# Patient Record
Sex: Male | Born: 1955 | Race: White | Hispanic: No | Marital: Married | Smoking: Never smoker
Health system: Southern US, Community
[De-identification: ages and names within clinical notes are randomized; demographics above are authoritative.]

## PROBLEM LIST (undated history)

## (undated) DIAGNOSIS — E119 Type 2 diabetes mellitus without complications: Secondary | ICD-10-CM

## (undated) DIAGNOSIS — R0600 Dyspnea, unspecified: Secondary | ICD-10-CM

## (undated) DIAGNOSIS — M1990 Unspecified osteoarthritis, unspecified site: Secondary | ICD-10-CM

## (undated) DIAGNOSIS — G629 Polyneuropathy, unspecified: Secondary | ICD-10-CM

## (undated) DIAGNOSIS — I5032 Chronic diastolic (congestive) heart failure: Secondary | ICD-10-CM

## (undated) DIAGNOSIS — J189 Pneumonia, unspecified organism: Secondary | ICD-10-CM

## (undated) DIAGNOSIS — I10 Essential (primary) hypertension: Secondary | ICD-10-CM

## (undated) DIAGNOSIS — T4145XA Adverse effect of unspecified anesthetic, initial encounter: Secondary | ICD-10-CM

## (undated) DIAGNOSIS — J45909 Unspecified asthma, uncomplicated: Secondary | ICD-10-CM

## (undated) DIAGNOSIS — T8859XA Other complications of anesthesia, initial encounter: Secondary | ICD-10-CM

---

## 1981-06-16 HISTORY — PX: WRIST SURGERY: SHX841

## 2015-06-17 DIAGNOSIS — J189 Pneumonia, unspecified organism: Secondary | ICD-10-CM

## 2015-06-17 HISTORY — DX: Pneumonia, unspecified organism: J18.9

## 2016-01-18 ENCOUNTER — Other Ambulatory Visit: Payer: Self-pay | Admitting: Internal Medicine

## 2016-01-18 DIAGNOSIS — R600 Localized edema: Secondary | ICD-10-CM

## 2016-01-18 DIAGNOSIS — R0600 Dyspnea, unspecified: Secondary | ICD-10-CM

## 2016-01-29 ENCOUNTER — Other Ambulatory Visit (HOSPITAL_COMMUNITY): Payer: Self-pay

## 2016-03-03 ENCOUNTER — Other Ambulatory Visit (HOSPITAL_COMMUNITY): Payer: Self-pay

## 2016-03-08 DIAGNOSIS — E119 Type 2 diabetes mellitus without complications: Secondary | ICD-10-CM

## 2016-03-08 DIAGNOSIS — I10 Essential (primary) hypertension: Secondary | ICD-10-CM | POA: Diagnosis present

## 2016-03-24 ENCOUNTER — Ambulatory Visit
Admission: RE | Admit: 2016-03-24 | Discharge: 2016-03-24 | Disposition: A | Discharge status: Home / Self Care | Payer: 59 | Source: Ambulatory Visit | Attending: Internal Medicine | Admitting: Internal Medicine

## 2016-03-24 ENCOUNTER — Other Ambulatory Visit: Payer: Self-pay | Admitting: Internal Medicine

## 2016-03-24 DIAGNOSIS — J189 Pneumonia, unspecified organism: Secondary | ICD-10-CM

## 2016-03-24 DIAGNOSIS — J45901 Unspecified asthma with (acute) exacerbation: Secondary | ICD-10-CM

## 2016-08-07 ENCOUNTER — Ambulatory Visit
Admission: RE | Admit: 2016-08-07 | Discharge: 2016-08-07 | Disposition: A | Discharge status: Home / Self Care | Payer: 59 | Source: Ambulatory Visit | Attending: Internal Medicine | Admitting: Internal Medicine

## 2016-08-07 ENCOUNTER — Other Ambulatory Visit: Payer: Self-pay | Admitting: Internal Medicine

## 2016-08-07 DIAGNOSIS — J45901 Unspecified asthma with (acute) exacerbation: Secondary | ICD-10-CM

## 2017-01-29 ENCOUNTER — Other Ambulatory Visit: Payer: Self-pay | Admitting: Nurse Practitioner

## 2017-01-29 DIAGNOSIS — R1011 Right upper quadrant pain: Secondary | ICD-10-CM

## 2017-01-30 ENCOUNTER — Emergency Department (HOSPITAL_COMMUNITY)
Admission: EM | Admit: 2017-01-30 | Discharge: 2017-01-30 | Disposition: A | Discharge status: Home / Self Care | Payer: Managed Care, Other (non HMO) | Attending: Emergency Medicine | Admitting: Emergency Medicine

## 2017-01-30 ENCOUNTER — Ambulatory Visit
Admission: RE | Admit: 2017-01-30 | Discharge: 2017-01-30 | Disposition: A | Discharge status: Home / Self Care | Payer: Managed Care, Other (non HMO) | Source: Ambulatory Visit | Attending: Nurse Practitioner | Admitting: Nurse Practitioner

## 2017-01-30 ENCOUNTER — Encounter (HOSPITAL_COMMUNITY): Payer: Self-pay | Admitting: *Deleted

## 2017-01-30 DIAGNOSIS — I10 Essential (primary) hypertension: Secondary | ICD-10-CM | POA: Diagnosis not present

## 2017-01-30 DIAGNOSIS — R1011 Right upper quadrant pain: Secondary | ICD-10-CM

## 2017-01-30 DIAGNOSIS — K8020 Calculus of gallbladder without cholecystitis without obstruction: Secondary | ICD-10-CM | POA: Insufficient documentation

## 2017-01-30 DIAGNOSIS — E119 Type 2 diabetes mellitus without complications: Secondary | ICD-10-CM | POA: Insufficient documentation

## 2017-01-30 DIAGNOSIS — J45909 Unspecified asthma, uncomplicated: Secondary | ICD-10-CM | POA: Insufficient documentation

## 2017-01-30 HISTORY — DX: Unspecified asthma, uncomplicated: J45.909

## 2017-01-30 HISTORY — DX: Essential (primary) hypertension: I10

## 2017-01-30 HISTORY — DX: Type 2 diabetes mellitus without complications: E11.9

## 2017-01-30 LAB — COMPREHENSIVE METABOLIC PANEL
ALT: 23 U/L (ref 17–63)
ANION GAP: 8 (ref 5–15)
AST: 14 U/L — ABNORMAL LOW (ref 15–41)
Albumin: 3.8 g/dL (ref 3.5–5.0)
Alkaline Phosphatase: 43 U/L (ref 38–126)
BILIRUBIN TOTAL: 1.8 mg/dL — AB (ref 0.3–1.2)
BUN: 18 mg/dL (ref 6–20)
CO2: 28 mmol/L (ref 22–32)
Calcium: 9 mg/dL (ref 8.9–10.3)
Chloride: 99 mmol/L — ABNORMAL LOW (ref 101–111)
Creatinine, Ser: 1.05 mg/dL (ref 0.61–1.24)
Glucose, Bld: 295 mg/dL — ABNORMAL HIGH (ref 65–99)
POTASSIUM: 3.4 mmol/L — AB (ref 3.5–5.1)
Sodium: 135 mmol/L (ref 135–145)
TOTAL PROTEIN: 6.5 g/dL (ref 6.5–8.1)

## 2017-01-30 LAB — URINALYSIS, ROUTINE W REFLEX MICROSCOPIC
BACTERIA UA: NONE SEEN
BILIRUBIN URINE: NEGATIVE
Glucose, UA: >=500 mg/dL — AB
Hgb urine dipstick: NEGATIVE
KETONES UR: NEGATIVE mg/dL
LEUKOCYTES UA: NEGATIVE
NITRITE: NEGATIVE
PH: 6 (ref 5.0–8.0)
Protein, ur: 100 mg/dL — AB
Specific Gravity, Urine: 1.022 (ref 1.005–1.030)

## 2017-01-30 LAB — CBC
HEMATOCRIT: 44.5 % (ref 39.0–52.0)
HEMOGLOBIN: 15.6 g/dL (ref 13.0–17.0)
MCH: 29.4 pg (ref 26.0–34.0)
MCHC: 35.1 g/dL (ref 30.0–36.0)
MCV: 83.8 fL (ref 78.0–100.0)
Platelets: 206 10*3/uL (ref 150–400)
RBC: 5.31 MIL/uL (ref 4.22–5.81)
RDW: 14 % (ref 11.5–15.5)
WBC: 9 10*3/uL (ref 4.0–10.5)

## 2017-01-30 LAB — LIPASE, BLOOD: Lipase: 24 U/L (ref 11–51)

## 2017-01-30 NOTE — ED Triage Notes (Signed)
Pt sent from MD office with increased WBCs, abdominal pain and had ultrasound that showed acute chole with fluid and elevated lipase. Patient states he is to have surgery.

## 2017-01-30 NOTE — ED Provider Notes (Signed)
[facility]-EMERGENCY DEPT Provider Note   CSN: [id number] Arrival date & time: 01/30/17  1112     History   Chief Complaint Chief Complaint  Patient presents with  . Abdominal Pain    HPI Hunter Lewis is a 61 y.o. male.  HPI Patient presents to the emergency room for evaluation of abdominal pain. Yesterday morning the patient had an acute episode of pain in the right upper quadrant. This started after he ate breakfast.  He did not have any nausea or vomiting. He went to his primary care doctor's office. He had laboratory tests and was scheduled for an outpatient ultrasound that he had this morning. His laboratory tests yesterday reportedly showed abnormalities in his white blood cell count. His outpatient ultrasound today showed findings suggesting and of acute cholecystitis.  He was told to come to the emergency room.  Patient states his pain resolved after taking a Vicodin last night but he did have pain for most of the day. He has not had anything to eat or drink today however because he was told not to eat or drink anything in case he needed surgery. Past Medical History:  Diagnosis Date  . Asthma   . Diabetes mellitus without complication (HCC)   . Hypertension     There are no active problems to display for this patient.   Past Surgical History:  Procedure Laterality Date  . WRIST SURGERY         Home Medications    Prior to Admission medications   Not on File    Family History No family history on file.  Social History Social History  Substance Use Topics  . Smoking status: Never Smoker  . Smokeless tobacco: Never Used  . Alcohol use No     Allergies   Patient has no known allergies.   Review of Systems Review of Systems  All other systems reviewed and are negative.    Physical Exam Updated Vital Signs BP 137/81 (BP Location: Right Arm)   Pulse 83   Temp 98.7 F (37.1 C) (Oral)   Resp 18   SpO2 96%   Physical Exam  Constitutional: He  appears well-developed and well-nourished. No distress.  HENT:  Head: Normocephalic and atraumatic.  Right Ear: External ear normal.  Left Ear: External ear normal.  Eyes: Conjunctivae are normal. Right eye exhibits no discharge. Left eye exhibits no discharge. No scleral icterus.  Neck: Neck supple. No tracheal deviation present.  Cardiovascular: Normal rate, regular rhythm and intact distal pulses.   Pulmonary/Chest: Effort normal and breath sounds normal. No stridor. No respiratory distress. He has no wheezes. He has no rales.  Abdominal: Soft. Bowel sounds are normal. He exhibits no distension. There is tenderness (mild ruq). There is no rebound and no guarding.  Musculoskeletal: He exhibits no edema or tenderness.  Neurological: He is alert. He has normal strength. No cranial nerve deficit (no facial droop, extraocular movements intact, no slurred speech) or sensory deficit. He exhibits normal muscle tone. He displays no seizure activity. Coordination normal.  Skin: Skin is warm and dry. No rash noted.  Psychiatric: He has a normal mood and affect.  Nursing note and vitals reviewed.    ED Treatments / Results  Labs (all labs ordered are listed, but only abnormal results are displayed) Labs Reviewed  COMPREHENSIVE METABOLIC PANEL - Abnormal; Notable for the following:       Result Value   Potassium 3.4 (*)    Chloride 99 (*)  Glucose, Bld 295 (*)    AST 14 (*)    Total Bilirubin 1.8 (*)    All other components within normal limits  URINALYSIS, ROUTINE W REFLEX MICROSCOPIC - Abnormal; Notable for the following:    Glucose, UA >=500 (*)    Protein, ur 100 (*)    Squamous Epithelial / LPF 0-5 (*)    All other components within normal limits  LIPASE, BLOOD  CBC     Radiology US Abdomen Limited Ruq  Result Date: 01/30/2017 CLINICAL DATA:  Right upper quadrant pain for 2 days. EXAM: ULTRASOUND ABDOMEN LIMITED RIGHT UPPER QUADRANT COMPARISON:  None. FINDINGS: Gallbladder:  Layering small calcified gallstones are identified, measuring up to 7 mm diameter. There is gallbladder wall thickening with wall thickness measuring up to 6 mm in some areas. Apparent foci of pericholecystic fluid evident. The sonographer reports a positive sonographic Murphy sign. Common bile duct: Diameter: 3 mm Liver: Increased echogenicity suggests fatty deposition. IMPRESSION: Sonographic imaging features compatible with acute cholecystitis. These results will be called to the ordering clinician or representative by the Radiologist Assistant, and communication documented in the PACS or zVision Dashboard. Electronically Signed   By: Kennith Center M.D.   On: 01/30/2017 10:01    Procedures Procedures (including critical care time)  Medications Ordered in ED Medications - No data to display   Initial Impression / Assessment and Plan / ED Course  I have reviewed the triage vital signs and the nursing notes.  Pertinent labs & imaging results that were available during my care of the patient were reviewed by me and considered in my medical decision making (see chart for details).  Clinical Course as of Jan 30 1801  Fri Jan 30, 2017  1715 Discussed case with Dr Lindie Spruce.  He will see the patient in the ED  [JK]  1750 Dr Lindie Spruce discussed options with the patient.  Pt would prefer to go home.  He is pain free.  [JK]    Clinical Course User Index [JK] Linwood Dibbles, MD   Patient presented to the emergency room for evaluation of possible acute cholecystitis. Pt states he was told to come to the ED to have surgery. The patient's ultrasound this morning showed findings suggestive of acute cholecystitis. Patient's laboratory tests emergency room however are reassuring. He states his pain has primarily resolved although he has some very mild tenderness to palpation right upper quadrant. It has remained nothing by mouth throughout the day. I will consult with general surgery.  Patient was seen by Dr. Lindie Spruce.   Options were discussed with the patient. He has a vacation trip coming up that he would like to attend. He continues to feel well and is ready to go home.  Final Clinical Impressions(s) / ED Diagnoses   Final diagnoses:  Calculus of gallbladder without cholecystitis without obstruction    New Prescriptions New Prescriptions   No medications on file     Linwood Dibbles, MD 01/30/17 [phone]

## 2017-02-17 ENCOUNTER — Ambulatory Visit: Payer: Self-pay | Admitting: General Surgery

## 2017-03-11 ENCOUNTER — Inpatient Hospital Stay (HOSPITAL_COMMUNITY)
Admission: RE | Admit: 2017-03-11 | Discharge: 2017-03-11 | Disposition: A | Discharge status: Home / Self Care | Payer: 59 | Source: Ambulatory Visit

## 2017-03-11 NOTE — Pre-Procedure Instructions (Signed)
Hunter Lewis  03/11/2017      [id number] - [place], [place] - [address] [place] [address] Phone: [phone] Fax: [phone]    Your procedure is scheduled on Monday, 03/16/2017.  Report to [facility] Admitting at 0530 A.M.  Call this number if you have problems the morning of surgery:  [phone]   Remember:  Do not eat food or drink liquids after midnight.    Continue all other medications as directed by your physician except follow these medication instructions before surgery   Take these medicines the morning of surgery with A SIP OF WATER:  Albuterol inhaler - if needed  Albuterol nebulizer - if needed  Amlodipine (Norvasc)  Metoprolol Succinate (Toprol-XL)    7 days prior to surgery STOP taking any Aspirin, Aleve, Naproxen, Ibuprofen, Motrin, Advil, Goody's, BC's, all herbal medications, fish oil, and all vitamins   WHAT DO I DO ABOUT MY DIABETES MEDICATION? o Do not take oral diabetes medicines (pills) the morning of surgery.  o THE NIGHT BEFORE SURGERY, take 15 units of Glargine (Lantus) insulin. o THE MORNING OF SURGERY no insulin  o The day of surgery, do not take other diabetes injectables, including Byetta (exenatide), Bydureon (exenatide ER), Victoza (liraglutide), or Trulicity (dulaglutide).  o If your CBG is greater than 220 mg/dL, you may take  of your sliding scale (correction) dose of insulin.   How to Manage Your Diabetes Before and After Surgery  Why is it important to control my blood sugar before and after surgery? . Improving blood sugar levels before and after surgery helps healing and can limit problems. . A way of improving blood sugar control is eating a healthy diet by: o  Eating less sugar and carbohydrates o  Increasing activity/exercise o  Talking with your doctor about reaching your blood sugar goals . High blood sugars (greater than 180 mg/dL) can raise your risk of infections  and slow your recovery, so you will need to focus on controlling your diabetes during the weeks before surgery. . Make sure that the doctor who takes care of your diabetes knows about your planned surgery including the date and location.  How do I manage my blood sugar before surgery? . Check your blood sugar at least 4 times a day, starting 2 days before surgery, to make sure that the level is not too high or low. o Check your blood sugar the morning of your surgery when you wake up and every 2 hours until you get to the Short Stay unit. . If your blood sugar is less than 70 mg/dL, you will need to treat for low blood sugar: o Do not take insulin. o Treat a low blood sugar (less than 70 mg/dL) with  cup of clear juice (cranberry or apple), 4 glucose tablets, OR glucose gel. o Recheck blood sugar in 15 minutes after treatment (to make sure it is greater than 70 mg/dL). If your blood sugar is not greater than 70 mg/dL on recheck, call [phone] for further instructions. . Report your blood sugar to the short stay nurse when you get to Short Stay.  . If you are admitted to the hospital after surgery: o Your blood sugar will be checked by the staff and you will probably be given insulin after surgery (instead of oral diabetes medicines) to make sure you have good blood sugar levels. o The goal for blood sugar control after surgery is 80-180 mg/dL.  Do not wear jewelry, make-up or nail polish.  Do not wear lotions, powders, or perfumes, or deoderant.  Do not shave 48 hours prior to surgery.  Men may shave face and neck.  Do not bring valuables to the hospital.  [facility] is not responsible for any belongings or valuables.  Contacts, eyeglasses, dentures or bridgework may not be worn into surgery.  Leave your suitcase in the car.  After surgery it may be brought to your room.  For patients admitted to the hospital, discharge time will be determined by your treatment team.  Patients  discharged the day of surgery will not be allowed to drive home.   Name and phone number of your driver:    Special instructions:   [facility]- Preparing For Surgery  Before surgery, you can play an important role. Because skin is not sterile, your skin needs to be as free of germs as possible. You can reduce the number of germs on your skin by washing with CHG (chlorahexidine gluconate) Soap before surgery.  CHG is an antiseptic cleaner which kills germs and bonds with the skin to continue killing germs even after washing.  Please do not use if you have an allergy to CHG or antibacterial soaps. If your skin becomes reddened/irritated stop using the CHG.  Do not shave (including legs and underarms) for at least 48 hours prior to first CHG shower. It is OK to shave your face.  Please follow these instructions carefully.   1. Shower the NIGHT BEFORE SURGERY and the MORNING OF SURGERY with CHG.   2. If you chose to wash your hair, wash your hair first as usual with your normal shampoo.  3. After you shampoo, rinse your hair and body thoroughly to remove the shampoo.  4. Use CHG as you would any other liquid soap. You can apply CHG directly to the skin and wash gently with a scrungie or a clean washcloth.   5. Apply the CHG Soap to your body ONLY FROM THE NECK DOWN.  Do not use on open wounds or open sores. Avoid contact with your eyes, ears, mouth and genitals (private parts). Wash genitals (private parts) with your normal soap.  6. Wash thoroughly, paying special attention to the area where your surgery will be performed.  7. Thoroughly rinse your body with warm water from the neck down.  8. DO NOT shower/wash with your normal soap after using and rinsing off the CHG Soap.  9. Pat yourself dry with a CLEAN TOWEL.   10. Wear CLEAN PAJAMAS   11. Place CLEAN SHEETS on your bed the night of your first shower and DO NOT SLEEP WITH PETS.    Day of Surgery: Do not apply any  deodorants/lotions. Please wear clean clothes to the hospital/surgery center.      Please read over the following fact sheets that you were given. Pain Booklet, Coughing and Deep Breathing and Surgical Site Infection Prevention

## 2017-03-13 ENCOUNTER — Encounter (HOSPITAL_COMMUNITY): Payer: Self-pay

## 2017-03-13 ENCOUNTER — Encounter (HOSPITAL_COMMUNITY)
Admission: RE | Admit: 2017-03-13 | Discharge: 2017-03-13 | Disposition: A | Discharge status: Home / Self Care | Payer: Managed Care, Other (non HMO) | Source: Ambulatory Visit | Attending: General Surgery | Admitting: General Surgery

## 2017-03-13 DIAGNOSIS — Z8249 Family history of ischemic heart disease and other diseases of the circulatory system: Secondary | ICD-10-CM | POA: Diagnosis not present

## 2017-03-13 DIAGNOSIS — Z79891 Long term (current) use of opiate analgesic: Secondary | ICD-10-CM | POA: Diagnosis not present

## 2017-03-13 DIAGNOSIS — Z836 Family history of other diseases of the respiratory system: Secondary | ICD-10-CM | POA: Diagnosis not present

## 2017-03-13 DIAGNOSIS — Z833 Family history of diabetes mellitus: Secondary | ICD-10-CM | POA: Diagnosis not present

## 2017-03-13 DIAGNOSIS — Z79899 Other long term (current) drug therapy: Secondary | ICD-10-CM | POA: Diagnosis not present

## 2017-03-13 DIAGNOSIS — Z9889 Other specified postprocedural states: Secondary | ICD-10-CM | POA: Diagnosis not present

## 2017-03-13 DIAGNOSIS — K8010 Calculus of gallbladder with chronic cholecystitis without obstruction: Secondary | ICD-10-CM | POA: Diagnosis not present

## 2017-03-13 DIAGNOSIS — Z7984 Long term (current) use of oral hypoglycemic drugs: Secondary | ICD-10-CM | POA: Diagnosis not present

## 2017-03-13 DIAGNOSIS — M1990 Unspecified osteoarthritis, unspecified site: Secondary | ICD-10-CM | POA: Diagnosis not present

## 2017-03-13 DIAGNOSIS — J90 Pleural effusion, not elsewhere classified: Secondary | ICD-10-CM | POA: Diagnosis not present

## 2017-03-13 DIAGNOSIS — J45909 Unspecified asthma, uncomplicated: Secondary | ICD-10-CM | POA: Diagnosis not present

## 2017-03-13 DIAGNOSIS — I10 Essential (primary) hypertension: Secondary | ICD-10-CM | POA: Diagnosis not present

## 2017-03-13 DIAGNOSIS — E119 Type 2 diabetes mellitus without complications: Secondary | ICD-10-CM | POA: Diagnosis not present

## 2017-03-13 DIAGNOSIS — Z01818 Encounter for other preprocedural examination: Secondary | ICD-10-CM

## 2017-03-13 HISTORY — DX: Pneumonia, unspecified organism: J18.9

## 2017-03-13 HISTORY — DX: Dyspnea, unspecified: R06.00

## 2017-03-13 HISTORY — DX: Adverse effect of unspecified anesthetic, initial encounter: T41.45XA

## 2017-03-13 HISTORY — DX: Polyneuropathy, unspecified: G62.9

## 2017-03-13 HISTORY — DX: Other complications of anesthesia, initial encounter: T88.59XA

## 2017-03-13 HISTORY — DX: Unspecified osteoarthritis, unspecified site: M19.90

## 2017-03-13 LAB — CBC WITH DIFFERENTIAL/PLATELET
BASOS ABS: 0.1 10*3/uL (ref 0.0–0.1)
Basophils Relative: 1 %
EOS PCT: 2 %
Eosinophils Absolute: 0.2 10*3/uL (ref 0.0–0.7)
HCT: 47.3 % (ref 39.0–52.0)
Hemoglobin: 15.8 g/dL (ref 13.0–17.0)
LYMPHS ABS: 0.9 10*3/uL (ref 0.7–4.0)
LYMPHS PCT: 11 %
MCH: 28.2 pg (ref 26.0–34.0)
MCHC: 33.4 g/dL (ref 30.0–36.0)
MCV: 84.3 fL (ref 78.0–100.0)
MONO ABS: 0.5 10*3/uL (ref 0.1–1.0)
MONOS PCT: 6 %
Neutro Abs: 6.8 10*3/uL (ref 1.7–7.7)
Neutrophils Relative %: 80 %
PLATELETS: 219 10*3/uL (ref 150–400)
RBC: 5.61 MIL/uL (ref 4.22–5.81)
RDW: 14 % (ref 11.5–15.5)
WBC: 8.4 10*3/uL (ref 4.0–10.5)

## 2017-03-13 LAB — COMPREHENSIVE METABOLIC PANEL
ALT: 30 U/L (ref 17–63)
ANION GAP: 9 (ref 5–15)
AST: 21 U/L (ref 15–41)
Albumin: 4.2 g/dL (ref 3.5–5.0)
Alkaline Phosphatase: 82 U/L (ref 38–126)
BILIRUBIN TOTAL: 1.2 mg/dL (ref 0.3–1.2)
BUN: 22 mg/dL — ABNORMAL HIGH (ref 6–20)
CHLORIDE: 98 mmol/L — AB (ref 101–111)
CO2: 29 mmol/L (ref 22–32)
Calcium: 9.5 mg/dL (ref 8.9–10.3)
Creatinine, Ser: 1.19 mg/dL (ref 0.61–1.24)
Glucose, Bld: 296 mg/dL — ABNORMAL HIGH (ref 65–99)
POTASSIUM: 3.5 mmol/L (ref 3.5–5.1)
Sodium: 136 mmol/L (ref 135–145)
TOTAL PROTEIN: 6.9 g/dL (ref 6.5–8.1)

## 2017-03-13 LAB — HEMOGLOBIN A1C
Hgb A1c MFr Bld: 9.1 % — ABNORMAL HIGH (ref 4.8–5.6)
MEAN PLASMA GLUCOSE: 214.47 mg/dL

## 2017-03-13 LAB — GLUCOSE, CAPILLARY: GLUCOSE-CAPILLARY: 307 mg/dL — AB (ref 65–99)

## 2017-03-13 NOTE — Progress Notes (Signed)
Mr Hunter Lewis has Type II diabetes, it is managed by DR Polite, patient's CBG.  Patient does not remember last A1C, when it was or the result.  "I haven't seen Dr Nehemiah Settle in a while."  Patient reports that normal fasting CBG is 150- 180.  Patient took mediation for diabetes including 45 units of Humalog., he did not eat, had coffee with powdered creamer.

## 2017-03-13 NOTE — Pre-Procedure Instructions (Signed)
Hunter Lewis  03/13/2017      [id number] - [place], [place] - [address] [place] [address] Phone: [phone] Fax: [phone]    Your procedure is scheduled on Monday, 03/16/2017.  Report to [facility] Admitting at 0530 A.M.  Call this number if you have problems the morning of surgery:  [phone]   Remember:  Do not eat food or drink liquids after midnight.    Continue all other medications as directed by your physician except follow these medication instructions before surgery   Take these medicines the morning of surgery with A SIP OF WATER:  Albuterol inhaler - if needed  Albuterol nebulizer - if needed  Amlodipine (Norvasc)  Metoprolol Succinate (Toprol-XL)    7 days prior to surgery STOP taking any Aspirin, Aleve, Naproxen, Ibuprofen, Motrin, Advil, Goody's, BC's, all herbal medications, fish oil, and all vitamins   WHAT DO I DO ABOUT MY DIABETES MEDICATION? o Do not take oral diabetes medicines (pills) the morning of surgery.  o THE NIGHT BEFORE SURGERY, take 15 units of Glargine (Lantus) insulin. o The day of surgery, do not take other diabetes injectables, including Byetta (exenatide), Bydureon (exenatide ER), Victoza (liraglutide), or Trulicity (dulaglutide).  o If your CBG is greater than 220 mg/dL, you may take  of your sliding scale (correction) dose of insulin.   How to Manage Your Diabetes Before and After Surgery  Why is it important to control my blood sugar before and after surgery? . Improving blood sugar levels before and after surgery helps healing and can limit problems. . A way of improving blood sugar control is eating a healthy diet by: o  Eating less sugar and carbohydrates o  Increasing activity/exercise o  Talking with your doctor about reaching your blood sugar goals . High blood sugars (greater than 180 mg/dL) can raise your risk of infections and slow your recovery, so you will  need to focus on controlling your diabetes during the weeks before surgery. . Make sure that the doctor who takes care of your diabetes knows about your planned surgery including the date and location.  How do I manage my blood sugar before surgery? . Check your blood sugar at least 4 times a day, starting 2 days before surgery, to make sure that the level is not too high or low. o Check your blood sugar the morning of your surgery when you wake up and every 2 hours until you get to the Short Stay unit. . If your blood sugar is less than 70 mg/dL, you will need to treat for low blood sugar: o Do not take insulin. o Treat a low blood sugar (less than 70 mg/dL) with  cup of clear juice (cranberry or apple), 4 glucose tablets, OR glucose gel. o Recheck blood sugar in 15 minutes after treatment (to make sure it is greater than 70 mg/dL). If your blood sugar is not greater than 70 mg/dL on recheck, call [phone] for further instructions. . Report your blood sugar to the short stay nurse when you get to Short Stay.  . If you are admitted to the hospital after surgery: o Your blood sugar will be checked by the staff and you will probably be given insulin after surgery (instead of oral diabetes medicines) to make sure you have good blood sugar levels. o The goal for blood sugar control after surgery is 80-180 mg/dL.     Do not wear jewelry, make-up  or nail polish.  Do not wear lotions, powders, or perfumes, or deoderant.  Do not shave 48 hours prior to surgery.  Men may shave face and neck.  Do not bring valuables to the hospital.  [facility] is not responsible for any belongings or valuables.  Contacts, eyeglasses, dentures or bridgework may not be worn into surgery.  Leave your suitcase in the car.  After surgery it may be brought to your room.  For patients admitted to the hospital, discharge time will be determined by your treatment team.  Patients discharged the day of surgery will not  be allowed to drive home.   Name and phone number of your driver:    Special instructions:   [facility]- Preparing For Surgery  Before surgery, you can play an important role. Because skin is not sterile, your skin needs to be as free of germs as possible. You can reduce the number of germs on your skin by washing with CHG (chlorahexidine gluconate) Soap before surgery.  CHG is an antiseptic cleaner which kills germs and bonds with the skin to continue killing germs even after washing.  Please do not use if you have an allergy to CHG or antibacterial soaps. If your skin becomes reddened/irritated stop using the CHG.  Do not shave (including legs and underarms) for at least 48 hours prior to first CHG shower. It is OK to shave your face.  Please follow these instructions carefully.   1. Shower the NIGHT BEFORE SURGERY and the MORNING OF SURGERY with CHG.   2. If you chose to wash your hair, wash your hair first as usual with your normal shampoo.  3. After you shampoo, rinse your hair and body thoroughly to remove the shampoo.  4. Use CHG as you would any other liquid soap. You can apply CHG directly to the skin and wash gently with a scrungie or a clean washcloth.   5. Apply the CHG Soap to your body ONLY FROM THE NECK DOWN.  Do not use on open wounds or open sores. Avoid contact with your eyes, ears, mouth and genitals (private parts). Wash genitals (private parts) with your normal soap.  6. Wash thoroughly, paying special attention to the area where your surgery will be performed.  7. Thoroughly rinse your body with warm water from the neck down.  8. DO NOT shower/wash with your normal soap after using and rinsing off the CHG Soap.  9. Pat yourself dry with a CLEAN TOWEL.   10. Wear CLEAN PAJAMAS   11. Place CLEAN SHEETS on your bed the night of your first shower and DO NOT SLEEP WITH PETS.    Day of Surgery: Do not apply any deodorants/lotions. Please wear clean clothes to  the hospital/surgery center.      Please read over the following fact sheets that you were given. Pain Booklet, Coughing and Deep Breathing and Surgical Site Infection Prevention

## 2017-03-13 NOTE — Progress Notes (Signed)
Anesthesia Chart Review:  Pt is a 61 year old male scheduled for laparoscopic cholecystectomy with intraoperative cholangiogram on 03/16/2017 with Jimmye Norman, MD  - PCP is Renford Dills, MD  PMH includes:  HTN, DM, asthma. Never smoker. BMI 34  Medications include: Albuterol, amlodipine, fenofibrate, Lasix, basaglar kwikpen, humalog, lisinopril-HCTZ, metformin, metoprolol  BP (!) 152/78   Pulse 87   Temp 36.6 C   Resp 20   Ht 6' (1.829 m)   Wt 249 lb (112.9 kg)   SpO2 95%   BMI 33.77 kg/m   Preoperative labs reviewed.  HbA1c 9.1, glucose 296.   CXR 03/13/17:  - Interstitial prominence and thickening with areas of scarring. Suspect chronic inflammatory type change, although mild interstitial edema cannot be entirely excluded. No airspace consolidation. - Minimal left pleural effusion. Heart size normal. Mild pulmonary venous hypertension is noted. A degree of pulmonary vascular congestion may well present.  EKG 03/13/17: NSR  Reviewed case with Dr. Michelle Piper.  Pt will need further assessment by assigned anesthesiologist DOS.   Rica Mast, FNP-BC [facility] Short Stay Surgical Center/Anesthesiology Phone: [phone] 03/13/2017 10:42 AM

## 2017-03-16 ENCOUNTER — Encounter (HOSPITAL_COMMUNITY): Payer: Self-pay | Admitting: Certified Registered"

## 2017-03-16 ENCOUNTER — Ambulatory Visit (HOSPITAL_COMMUNITY)
Admission: RE | Admit: 2017-03-16 | Discharge: 2017-03-16 | Disposition: A | Discharge status: Home / Self Care | Payer: Managed Care, Other (non HMO) | Source: Ambulatory Visit | Attending: General Surgery | Admitting: General Surgery

## 2017-03-16 ENCOUNTER — Encounter (HOSPITAL_COMMUNITY)
Admission: RE | Disposition: A | Discharge status: Home / Self Care | Payer: Self-pay | Source: Ambulatory Visit | Attending: General Surgery

## 2017-03-16 ENCOUNTER — Ambulatory Visit (HOSPITAL_COMMUNITY): Payer: Managed Care, Other (non HMO) | Admitting: Emergency Medicine

## 2017-03-16 ENCOUNTER — Ambulatory Visit (HOSPITAL_COMMUNITY): Payer: Managed Care, Other (non HMO) | Admitting: Anesthesiology

## 2017-03-16 DIAGNOSIS — K8010 Calculus of gallbladder with chronic cholecystitis without obstruction: Secondary | ICD-10-CM | POA: Diagnosis not present

## 2017-03-16 DIAGNOSIS — Z79899 Other long term (current) drug therapy: Secondary | ICD-10-CM | POA: Insufficient documentation

## 2017-03-16 DIAGNOSIS — J45909 Unspecified asthma, uncomplicated: Secondary | ICD-10-CM | POA: Insufficient documentation

## 2017-03-16 DIAGNOSIS — E119 Type 2 diabetes mellitus without complications: Secondary | ICD-10-CM | POA: Insufficient documentation

## 2017-03-16 DIAGNOSIS — M1990 Unspecified osteoarthritis, unspecified site: Secondary | ICD-10-CM | POA: Insufficient documentation

## 2017-03-16 DIAGNOSIS — Z79891 Long term (current) use of opiate analgesic: Secondary | ICD-10-CM | POA: Insufficient documentation

## 2017-03-16 DIAGNOSIS — J90 Pleural effusion, not elsewhere classified: Secondary | ICD-10-CM | POA: Insufficient documentation

## 2017-03-16 DIAGNOSIS — Z836 Family history of other diseases of the respiratory system: Secondary | ICD-10-CM | POA: Insufficient documentation

## 2017-03-16 DIAGNOSIS — E1140 Type 2 diabetes mellitus with diabetic neuropathy, unspecified: Secondary | ICD-10-CM

## 2017-03-16 DIAGNOSIS — Z9889 Other specified postprocedural states: Secondary | ICD-10-CM | POA: Insufficient documentation

## 2017-03-16 DIAGNOSIS — I10 Essential (primary) hypertension: Secondary | ICD-10-CM | POA: Insufficient documentation

## 2017-03-16 DIAGNOSIS — Z794 Long term (current) use of insulin: Secondary | ICD-10-CM

## 2017-03-16 DIAGNOSIS — Z8249 Family history of ischemic heart disease and other diseases of the circulatory system: Secondary | ICD-10-CM | POA: Insufficient documentation

## 2017-03-16 DIAGNOSIS — Z7984 Long term (current) use of oral hypoglycemic drugs: Secondary | ICD-10-CM | POA: Insufficient documentation

## 2017-03-16 DIAGNOSIS — Z833 Family history of diabetes mellitus: Secondary | ICD-10-CM | POA: Insufficient documentation

## 2017-03-16 HISTORY — PX: CHOLECYSTECTOMY: SHX55

## 2017-03-16 LAB — GLUCOSE, CAPILLARY
GLUCOSE-CAPILLARY: 216 mg/dL — AB (ref 65–99)
GLUCOSE-CAPILLARY: 259 mg/dL — AB (ref 65–99)

## 2017-03-16 SURGERY — LAPAROSCOPIC CHOLECYSTECTOMY WITH INTRAOPERATIVE CHOLANGIOGRAM
Anesthesia: General | Site: Abdomen

## 2017-03-16 MED ORDER — BUPIVACAINE-EPINEPHRINE (PF) 0.25% -1:200000 IJ SOLN
INTRAMUSCULAR | Status: AC
  Filled 2017-03-16: qty 30

## 2017-03-16 MED ORDER — PROPOFOL 10 MG/ML IV BOLUS
INTRAVENOUS | Status: AC
  Filled 2017-03-16: qty 20

## 2017-03-16 MED ORDER — OXYCODONE HCL 5 MG/5ML PO SOLN: 5.0000 mg | Freq: Once | ORAL | Status: AC | PRN

## 2017-03-16 MED ORDER — ACETAMINOPHEN 500 MG PO TABS
1000.0000 mg | ORAL_TABLET | ORAL | Status: AC
  Administered 2017-03-16: 1000 mg via ORAL
  Filled 2017-03-16: qty 2

## 2017-03-16 MED ORDER — BUPIVACAINE-EPINEPHRINE 0.25% -1:200000 IJ SOLN
INTRAMUSCULAR | Status: DC | PRN
  Administered 2017-03-16: 30 mL

## 2017-03-16 MED ORDER — ACETAMINOPHEN 160 MG/5ML PO SOLN: 325.0000 mg | ORAL | Status: DC | PRN

## 2017-03-16 MED ORDER — ACETAMINOPHEN 325 MG PO TABS: 325.0000 mg | ORAL_TABLET | ORAL | Status: DC | PRN

## 2017-03-16 MED ORDER — OXYCODONE HCL 5 MG PO TABS
ORAL_TABLET | ORAL | Status: AC
  Filled 2017-03-16: qty 1

## 2017-03-16 MED ORDER — CHLORHEXIDINE GLUCONATE CLOTH 2 % EX PADS: 6.0000 | MEDICATED_PAD | Freq: Once | CUTANEOUS | Status: DC

## 2017-03-16 MED ORDER — ALBUTEROL SULFATE HFA 108 (90 BASE) MCG/ACT IN AERS
INHALATION_SPRAY | RESPIRATORY_TRACT | Status: AC
  Filled 2017-03-16: qty 6.7

## 2017-03-16 MED ORDER — FENTANYL CITRATE (PF) 100 MCG/2ML IJ SOLN
25.0000 ug | INTRAMUSCULAR | Status: DC | PRN
  Administered 2017-03-16: 50 ug via INTRAVENOUS

## 2017-03-16 MED ORDER — ALBUTEROL SULFATE HFA 108 (90 BASE) MCG/ACT IN AERS
INHALATION_SPRAY | RESPIRATORY_TRACT | Status: DC | PRN
  Administered 2017-03-16: 2 via RESPIRATORY_TRACT
  Administered 2017-03-16: 4 via RESPIRATORY_TRACT

## 2017-03-16 MED ORDER — MIDAZOLAM HCL 2 MG/2ML IJ SOLN
INTRAMUSCULAR | Status: AC
  Filled 2017-03-16: qty 2

## 2017-03-16 MED ORDER — HEMOSTATIC AGENTS (NO CHARGE) OPTIME
TOPICAL | Status: DC | PRN
  Administered 2017-03-16 (×2): 1 via TOPICAL

## 2017-03-16 MED ORDER — ROCURONIUM BROMIDE 10 MG/ML (PF) SYRINGE
PREFILLED_SYRINGE | INTRAVENOUS | Status: AC
  Filled 2017-03-16: qty 5

## 2017-03-16 MED ORDER — ONDANSETRON HCL 4 MG/2ML IJ SOLN
INTRAMUSCULAR | Status: AC
  Filled 2017-03-16: qty 2

## 2017-03-16 MED ORDER — IOPAMIDOL (ISOVUE-300) INJECTION 61%
INTRAVENOUS | Status: AC
  Filled 2017-03-16: qty 50

## 2017-03-16 MED ORDER — CEFOTETAN DISODIUM-DEXTROSE 2-2.08 GM-% IV SOLR
2.0000 g | INTRAVENOUS | Status: AC
  Administered 2017-03-16: 2 g via INTRAVENOUS
  Filled 2017-03-16: qty 50

## 2017-03-16 MED ORDER — FENTANYL CITRATE (PF) 250 MCG/5ML IJ SOLN
INTRAMUSCULAR | Status: DC | PRN
  Administered 2017-03-16: 150 ug via INTRAVENOUS

## 2017-03-16 MED ORDER — SODIUM CHLORIDE 0.9 % IR SOLN
Status: DC | PRN
  Administered 2017-03-16 (×3): 1000 mL

## 2017-03-16 MED ORDER — ROCURONIUM BROMIDE 10 MG/ML (PF) SYRINGE
PREFILLED_SYRINGE | INTRAVENOUS | Status: DC | PRN
  Administered 2017-03-16: 70 mg via INTRAVENOUS

## 2017-03-16 MED ORDER — GABAPENTIN 300 MG PO CAPS
300.0000 mg | ORAL_CAPSULE | ORAL | Status: AC
  Administered 2017-03-16: 300 mg via ORAL
  Filled 2017-03-16: qty 1

## 2017-03-16 MED ORDER — LACTATED RINGERS IV SOLN
INTRAVENOUS | Status: DC | PRN
  Administered 2017-03-16: 07:00:00 via INTRAVENOUS

## 2017-03-16 MED ORDER — LIDOCAINE 2% (20 MG/ML) 5 ML SYRINGE
INTRAMUSCULAR | Status: AC
  Filled 2017-03-16: qty 5

## 2017-03-16 MED ORDER — SUGAMMADEX SODIUM 200 MG/2ML IV SOLN
INTRAVENOUS | Status: DC | PRN
  Administered 2017-03-16: 200 mg via INTRAVENOUS

## 2017-03-16 MED ORDER — PROPOFOL 10 MG/ML IV BOLUS
INTRAVENOUS | Status: DC | PRN
  Administered 2017-03-16: 180 mg via INTRAVENOUS
  Administered 2017-03-16: 20 mg via INTRAVENOUS

## 2017-03-16 MED ORDER — FENTANYL CITRATE (PF) 100 MCG/2ML IJ SOLN
INTRAMUSCULAR | Status: AC
  Filled 2017-03-16: qty 2

## 2017-03-16 MED ORDER — 0.9 % SODIUM CHLORIDE (POUR BTL) OPTIME
TOPICAL | Status: DC | PRN
  Administered 2017-03-16: 1000 mL

## 2017-03-16 MED ORDER — FENTANYL CITRATE (PF) 250 MCG/5ML IJ SOLN
INTRAMUSCULAR | Status: AC
  Filled 2017-03-16: qty 5

## 2017-03-16 MED ORDER — LIDOCAINE 2% (20 MG/ML) 5 ML SYRINGE
INTRAMUSCULAR | Status: DC | PRN
  Administered 2017-03-16: 60 mg via INTRAVENOUS

## 2017-03-16 MED ORDER — ONDANSETRON HCL 4 MG/2ML IJ SOLN
INTRAMUSCULAR | Status: DC | PRN
  Administered 2017-03-16: 4 mg via INTRAVENOUS

## 2017-03-16 MED ORDER — OXYCODONE HCL 5 MG PO TABS
5.0000 mg | ORAL_TABLET | Freq: Once | ORAL | Status: AC | PRN
  Administered 2017-03-16: 5 mg via ORAL

## 2017-03-16 MED ORDER — STERILE WATER FOR IRRIGATION IR SOLN
Status: DC | PRN
  Administered 2017-03-16: 60 mL

## 2017-03-16 MED ORDER — HYDROCODONE-ACETAMINOPHEN 5-325 MG PO TABS
1.0000 | ORAL_TABLET | Freq: Four times a day (QID) | ORAL | 0 refills | Status: DC | PRN
Start: 2017-03-16 — End: 2022-07-17

## 2017-03-16 SURGICAL SUPPLY — 51 items
APPLIER CLIP 5 13 M/L LIGAMAX5 (MISCELLANEOUS) ×3
BLADE CLIPPER SURG (BLADE) ×3 IMPLANT
CANISTER SUCT 3000ML PPV (MISCELLANEOUS) ×6 IMPLANT
CHLORAPREP W/TINT 26ML (MISCELLANEOUS) ×3 IMPLANT
CLIP APPLIE 5 13 M/L LIGAMAX5 (MISCELLANEOUS) ×1 IMPLANT
CLOSURE WOUND 1/2 X4 (GAUZE/BANDAGES/DRESSINGS) ×1
COVER MAYO STAND STRL (DRAPES) ×3 IMPLANT
COVER SURGICAL LIGHT HANDLE (MISCELLANEOUS) ×3 IMPLANT
DERMABOND ADVANCED (GAUZE/BANDAGES/DRESSINGS) ×2
DERMABOND ADVANCED .7 DNX12 (GAUZE/BANDAGES/DRESSINGS) ×1 IMPLANT
DRAPE C-ARM 42X72 X-RAY (DRAPES) IMPLANT
DRSG TEGADERM 2-3/8X2-3/4 SM (GAUZE/BANDAGES/DRESSINGS) ×3 IMPLANT
ELECT REM PT RETURN 9FT ADLT (ELECTROSURGICAL) ×3
ELECTRODE REM PT RTRN 9FT ADLT (ELECTROSURGICAL) ×1 IMPLANT
GLOVE BIO SURGEON STRL SZ7 (GLOVE) ×6 IMPLANT
GLOVE BIO SURGEON STRL SZ7.5 (GLOVE) ×3 IMPLANT
GLOVE BIOGEL PI IND STRL 6.5 (GLOVE) ×2 IMPLANT
GLOVE BIOGEL PI IND STRL 7.0 (GLOVE) ×1 IMPLANT
GLOVE BIOGEL PI IND STRL 7.5 (GLOVE) ×1 IMPLANT
GLOVE BIOGEL PI IND STRL 8 (GLOVE) ×1 IMPLANT
GLOVE BIOGEL PI INDICATOR 6.5 (GLOVE) ×4
GLOVE BIOGEL PI INDICATOR 7.0 (GLOVE) ×2
GLOVE BIOGEL PI INDICATOR 7.5 (GLOVE) ×2
GLOVE BIOGEL PI INDICATOR 8 (GLOVE) ×2
GLOVE ECLIPSE 7.5 STRL STRAW (GLOVE) ×3 IMPLANT
GLOVE SURG SS PI 6.5 STRL IVOR (GLOVE) ×3 IMPLANT
GOWN STRL REUS W/ TWL LRG LVL3 (GOWN DISPOSABLE) ×5 IMPLANT
GOWN STRL REUS W/TWL LRG LVL3 (GOWN DISPOSABLE) ×10
HEMOSTAT SNOW SURGICEL 2X4 (HEMOSTASIS) ×6 IMPLANT
KIT BASIN OR (CUSTOM PROCEDURE TRAY) ×3 IMPLANT
KIT ROOM TURNOVER OR (KITS) ×3 IMPLANT
L-HOOK LAP DISP 36CM (ELECTROSURGICAL)
LHOOK LAP DISP 36CM (ELECTROSURGICAL) IMPLANT
NS IRRIG 1000ML POUR BTL (IV SOLUTION) ×3 IMPLANT
PAD ARMBOARD 7.5X6 YLW CONV (MISCELLANEOUS) ×3 IMPLANT
PENCIL BUTTON HOLSTER BLD 10FT (ELECTRODE) IMPLANT
POUCH RETRIEVAL ECOSAC 10 (ENDOMECHANICALS) ×1 IMPLANT
POUCH RETRIEVAL ECOSAC 10MM (ENDOMECHANICALS) ×2
SCISSORS LAP 5X35 DISP (ENDOMECHANICALS) ×3 IMPLANT
SET CHOLANGIOGRAPH 5 50 .035 (SET/KITS/TRAYS/PACK) IMPLANT
SET IRRIG TUBING LAPAROSCOPIC (IRRIGATION / IRRIGATOR) ×3 IMPLANT
SLEEVE ENDOPATH XCEL 5M (ENDOMECHANICALS) ×6 IMPLANT
SPECIMEN JAR SMALL (MISCELLANEOUS) ×3 IMPLANT
STRIP CLOSURE SKIN 1/2X4 (GAUZE/BANDAGES/DRESSINGS) ×2 IMPLANT
SUT MNCRL AB 4-0 PS2 18 (SUTURE) ×3 IMPLANT
TOWEL OR 17X24 6PK STRL BLUE (TOWEL DISPOSABLE) ×3 IMPLANT
TOWEL OR 17X26 10 PK STRL BLUE (TOWEL DISPOSABLE) IMPLANT
TRAY LAPAROSCOPIC MC (CUSTOM PROCEDURE TRAY) ×3 IMPLANT
TROCAR XCEL BLUNT TIP 100MML (ENDOMECHANICALS) ×3 IMPLANT
TROCAR XCEL NON-BLD 5MMX100MML (ENDOMECHANICALS) ×3 IMPLANT
TUBING INSUFFLATION (TUBING) ×3 IMPLANT

## 2017-03-16 NOTE — Op Note (Signed)
OPERATIVE REPORT  DATE OF OPERATION: 03/16/2017  PATIENT:  Hunter Lewis  61 y.o. male  PRE-OPERATIVE DIAGNOSIS:  SYMPTOMATIC CHOLELITHISIS  POST-OPERATIVE DIAGNOSIS:  SYMPTOMATIC CHOLELITHISIS  INDICATION(S) FOR OPERATION:  Severe symptoms from known gall stones.  FINDINGS:  Multipoel small gallstones with 6-7 mc cystic duct, not IOC performed, anatomy very clear  PROCEDURE:  Procedure(s): LAPAROSCOPIC CHOLECYSTECTOMY  SURGEON:  Surgeon(s): Jimmye Norman, MD  ASSISTANTOrson Slick, RNFA  ANESTHESIA:   general  COMPLICATIONS:  None  EBL: 40 ml  BLOOD ADMINISTERED: none  DRAINS: none   SPECIMEN:  Source of Specimen:  Gallbladder and stones  COUNTS CORRECT:  YES  PROCEDURE DETAILS: The patient was taken to the operating room and placed on the table in the supine position.  After an adequate endotracheal anesthetic was administered, the patient was prepped with ChloroPrep, and then draped in the usual manner exposing the entire abdomen laterally, inferiorly and up  to the costal margins.  After a proper timeout was performed including identifying the patient and the procedure to be performed, a supraumbilical 1.5cm midline incision was made using a #15 blade.  This was taken down to the fascia which was then incised with a #15 blade.  The edges of the fascia were tented up with Kocher clamps as the preperitoneal space was penetrated with a Kelly clamp into the peritoneum.  Once this was done, a pursestring suture of 0 Vicryl was passed around the fascial opening.  This was subsequently used to secure the [facility] cannula which was passed into the peritoneal cavity.  Once the [facility] cannula was in place, carbon dioxide gas was insufflated into the peritoneal cavity up to a maximal intra-abdominal pressure of 15mm Hg.The laparoscope, with attached camera and light source, was passed into the peritoneal cavity to visualize the direct insertion of two right upper quadrant 5mm cannulas, and a  sup-xiphoid 5mm cannula.  Once all cannulas were in place, the dissection was begun.  The patient had a significant amount of adhesions of the omentum and duodenum to the gallbladder which had to be carefully dissected away in order to expose the infundibulum of the gallbladder and the cystic duct.  Two ratcheted graspers were attached to the dome and infundibulum of the gallbladder and retracted towards the anterior abdominal wall and the right upper quadrant.  Using cautery attached to a dissecting forceps, the peritoneum overlaying the triangle of Chalot and the hepatoduodenal triangle was dissected away exposing the cystic duct and the cystic artery.  A critical window was developed between the CBD and the cystic duct The cystic artery was clipped proximally and distally then transected.  A clip was placed on the gallbladder side of the cystic duct, then the distal cystic duct was clipped multiple times then transected between the clips.  The gallbladder was then dissected out of the hepatic bed without event.  It was retrieved from the abdomen (using an EndoCatch bag) without event.  Once the gallbladder was removed, the bed was inspected for hemostasis.  There was some venous bleeding from the gallbladder bed which was controlled primarily with SurgiCel snow x 2 pieces. Once excellent hemostasis was obtained all gas and fluids were aspirated from above the liver, then the cannulas were removed.  The supraumbilical incision was closed using the pursestring suture which was in place.  0.25% bupivicaine with epinephrine was injected at all sites.  All 10mm or greater cannula sites were close using a running subcuticular stitch of 4-0 Monocryl.  5.24mm cannula sites  were closed with Dermabond only.Steri-Strips and Tagaderm were used to complete the dressings at all sites.  At this point all needle, sponge, and instrument counts were correct.The patient was awakened from anesthesia and taken to the PACU in  stable condition.  [name], MD, [name] [phone] [facility]  PATIENT DISPOSITION:  PACU - hemodynamically stable.   [name] 10/1/20188:53 AM

## 2017-03-16 NOTE — Addendum Note (Signed)
Addendum  created 03/16/17 1436 by Lucinda Dell, CRNA   Anesthesia Event deleted, Anesthesia Event edited

## 2017-03-16 NOTE — Anesthesia Procedure Notes (Signed)
Procedure Name: Intubation Date/Time: 03/16/2017 7:36 AM Performed by: Myna Bright Pre-anesthesia Checklist: Patient identified, Suction available, Emergency Drugs available and Patient being monitored Patient Re-evaluated:Patient Re-evaluated prior to induction Oxygen Delivery Method: Circle system utilized Preoxygenation: Pre-oxygenation with 100% oxygen Induction Type: IV induction Ventilation: Mask ventilation with difficulty, Two handed mask ventilation required and Oral airway inserted - appropriate to patient size Laryngoscope Size: Glidescope and 4 Tube type: Oral Tube size: 7.5 mm Number of attempts: 2 Airway Equipment and Method: Stylet and Video-laryngoscopy Placement Confirmation: ETT inserted through vocal cords under direct vision,  positive ETCO2 and breath sounds checked- equal and bilateral Secured at: 23 cm Tube secured with: Tape Dental Injury: Teeth and Oropharynx as per pre-operative assessment  Comments: Attempt x1 with Mac 4 blade, grade 2A view, unable to pass tube.  Switched to Glidescope with Mac 4 blade, grade 1 view, ETT easily passed.  M. Petraglia, SRNA

## 2017-03-16 NOTE — Transfer of Care (Signed)
Immediate Anesthesia Transfer of Care Note  Patient: Hunter Lewis  Procedure(s) Performed: LAPAROSCOPIC CHOLECYSTECTOMY (N/A Abdomen)  Patient Location: PACU  Anesthesia Type:General  Level of Consciousness: awake, alert , oriented and patient cooperative  Airway & Oxygen Therapy: Patient Spontanous Breathing and Patient connected to face mask oxygen  Post-op Assessment: Report given to RN, Post -op Vital signs reviewed and stable and Patient moving all extremities  Post vital signs: Reviewed and stable  Last Vitals:  Vitals:   03/16/17 0546 03/16/17 0859  BP: (!) 166/81 (!) 150/87  Pulse: 94 83  Resp: 18 13  Temp: 37 C 36.6 C  SpO2: 96% 96%    Last Pain: There were no vitals filed for this visit.       Complications: No apparent anesthesia complications

## 2017-03-16 NOTE — Anesthesia Postprocedure Evaluation (Signed)
Anesthesia Post Note  Patient: Yovanny Coats  Procedure(s) Performed: LAPAROSCOPIC CHOLECYSTECTOMY (N/A Abdomen)     Patient location during evaluation: PACU Anesthesia Type: General Level of consciousness: awake and alert Pain management: pain level controlled Vital Signs Assessment: post-procedure vital signs reviewed and stable Respiratory status: spontaneous breathing, nonlabored ventilation, respiratory function stable and patient connected to nasal cannula oxygen Cardiovascular status: blood pressure returned to baseline and stable Postop Assessment: no apparent nausea or vomiting Anesthetic complications: no    Last Vitals:  Vitals:   03/16/17 0950 03/16/17 1000  BP: 135/76   Pulse: 86 86  Resp: 18 18  Temp:    SpO2: 91% 93%    Last Pain:  Vitals:   03/16/17 1000  PainSc: 0-No pain                 Ridhaan Dreibelbis
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
## 2017-03-16 NOTE — Discharge Instructions (Addendum)
Laparoscopic Cholecystectomy, Care After °This sheet gives you information about how to care for yourself after your procedure. Your health care provider may also give you more specific instructions. If you have problems or questions, contact your health care provider. °What can I expect after the procedure? °After the procedure, it is common to have: °· Pain at your incision sites. You will be given medicines to control this pain. °· Mild nausea or vomiting. °· Bloating and possible shoulder pain from the air-like gas that was used during the procedure. ° °Follow these instructions at home: °Incision care ° °· Follow instructions from your health care provider about how to take care of your incisions. Make sure you: °? Wash your hands with soap and water before you change your bandage (dressing). If soap and water are not available, use hand sanitizer. °? Change your dressing as told by your health care provider. °? Leave stitches (sutures), skin glue, or adhesive strips in place. These skin closures may need to be in place for 2 weeks or longer. If adhesive strip edges start to loosen and curl up, you may trim the loose edges. Do not remove adhesive strips completely unless your health care provider tells you to do that. °· Do not take baths, swim, or use a hot tub until your health care provider approves. Ask your health care provider if you can take showers. You may only be allowed to take sponge baths for bathing. °· Check your incision area every day for signs of infection. Check for: °? More redness, swelling, or pain. °? More fluid or blood. °? Warmth. °? Pus or a bad smell. °Activity °· Do not drive or use heavy machinery while taking prescription pain medicine. °· Do not lift anything that is heavier than 10 lb (4.5 kg) until your health care provider approves. °· Do not play contact sports until your health care provider approves. °· Do not drive for 24 hours if you were given a medicine to help you relax  (sedative). °· Rest as needed. Do not return to work or school until your health care provider approves. °General instructions °· Take over-the-counter and prescription medicines only as told by your health care provider. °· To prevent or treat constipation while you are taking prescription pain medicine, your health care provider may recommend that you: °? Drink enough fluid to keep your urine clear or pale yellow. °? Take over-the-counter or prescription medicines. °? Eat foods that are high in fiber, such as fresh fruits and vegetables, whole grains, and beans. °? Limit foods that are high in fat and processed sugars, such as fried and sweet foods. °Contact a health care provider if: °· You develop a rash. °· You have more redness, swelling, or pain around your incisions. °· You have more fluid or blood coming from your incisions. °· Your incisions feel warm to the touch. °· You have pus or a bad smell coming from your incisions. °· You have a fever. °· One or more of your incisions breaks open. °Get help right away if: °· You have trouble breathing. °· You have chest pain. °· You have increasing pain in your shoulders. °· You faint or feel dizzy when you stand. °· You have severe pain in your abdomen. °· You have nausea or vomiting that lasts for more than one day. °· You have leg pain. °This information is not intended to replace advice given to you by your health care provider. Make sure you discuss any questions you   have with your health care provider. ° °James O. Wyatt, III, MD, [name] °[phone]--pager °[phone]--office °[facility] ° °

## 2017-03-16 NOTE — Anesthesia Preprocedure Evaluation (Signed)
Anesthesia Evaluation  Patient identified by MRN, date of birth, ID band Patient awake    Reviewed: Allergy & Precautions, NPO status , Patient's Chart, lab work & pertinent test results, reviewed documented beta blocker date and time   History of Anesthesia Complications Negative for: history of anesthetic complications  Airway Mallampati: III  TM Distance: >3 FB Neck ROM: Full    Dental  (+) Teeth Intact   Pulmonary shortness of breath, asthma ,     + wheezing      Cardiovascular hypertension, Pt. on medications and Pt. on home beta blockers (-) angina(-) Past MI and (-) CHF  Rhythm:Regular     Neuro/Psych negative neurological ROS  negative psych ROS   GI/Hepatic negative GI ROS, Neg liver ROS,   Endo/Other  diabetes, Type 2, Oral Hypoglycemic AgentsMorbid obesity  Renal/GU negative Renal ROS     Musculoskeletal  (+) Arthritis ,   Abdominal   Peds  Hematology negative hematology ROS (+)   Anesthesia Other Findings   Reproductive/Obstetrics                             Anesthesia Physical Anesthesia Plan  ASA: II  Anesthesia Plan: General   Post-op Pain Management:    Induction: Intravenous  PONV Risk Score and Plan: 2 and Ondansetron and Dexamethasone  Airway Management Planned: Oral ETT  Additional Equipment: None  Intra-op Plan:   Post-operative Plan: Extubation in OR  Informed Consent: I have reviewed the patients History and Physical, chart, labs and discussed the procedure including the risks, benefits and alternatives for the proposed anesthesia with the patient or authorized representative who has indicated his/her understanding and acceptance.   Dental advisory given  Plan Discussed with: CRNA and Surgeon  Anesthesia Plan Comments:         Anesthesia Quick Evaluation

## 2017-03-16 NOTE — Anesthesia Preprocedure Evaluation (Signed)
Anesthesia Evaluation  Patient identified by MRN, date of birth, ID band Patient awake    Reviewed: Allergy & Precautions, NPO status , Patient's Chart, lab work & pertinent test results  Airway Mallampati: III  TM Distance: >3 FB Neck ROM: Full    Dental  (+) Poor Dentition, Missing   Pulmonary shortness of breath, asthma ,    breath sounds clear to auscultation       Cardiovascular hypertension,  Rhythm:Regular Rate:Normal     Neuro/Psych    GI/Hepatic   Endo/Other  diabetes  Renal/GU      Musculoskeletal  (+) Arthritis ,   Abdominal (+) + obese,   Peds  Hematology   Anesthesia Other Findings   Reproductive/Obstetrics                             Anesthesia Physical Anesthesia Plan  ASA: III  Anesthesia Plan: General   Post-op Pain Management:    Induction: Intravenous  PONV Risk Score and Plan: Ondansetron  Airway Management Planned: Oral ETT  Additional Equipment:   Intra-op Plan:   Post-operative Plan:   Informed Consent: I have reviewed the patients History and Physical, chart, labs and discussed the procedure including the risks, benefits and alternatives for the proposed anesthesia with the patient or authorized representative who has indicated his/her understanding and acceptance.   Dental advisory given  Plan Discussed with: Anesthesiologist and Surgeon  Anesthesia Plan Comments:         Anesthesia Quick Evaluation

## 2017-03-17 ENCOUNTER — Encounter (HOSPITAL_COMMUNITY): Payer: Self-pay | Admitting: General Surgery

## 2017-10-08 DIAGNOSIS — J45909 Unspecified asthma, uncomplicated: Secondary | ICD-10-CM | POA: Insufficient documentation

## 2018-08-22 IMAGING — CR DG CHEST 2V
2 series · 2 of 2 positions shown · non-contrast
Comparison: No priors.

CLINICAL DATA: 60-year-old male with history of pneumonia diagnosed
2 weeks ago, currently feeling better. Wheezing on examination.

EXAM:
CHEST  2 VIEW

[w chest pa]
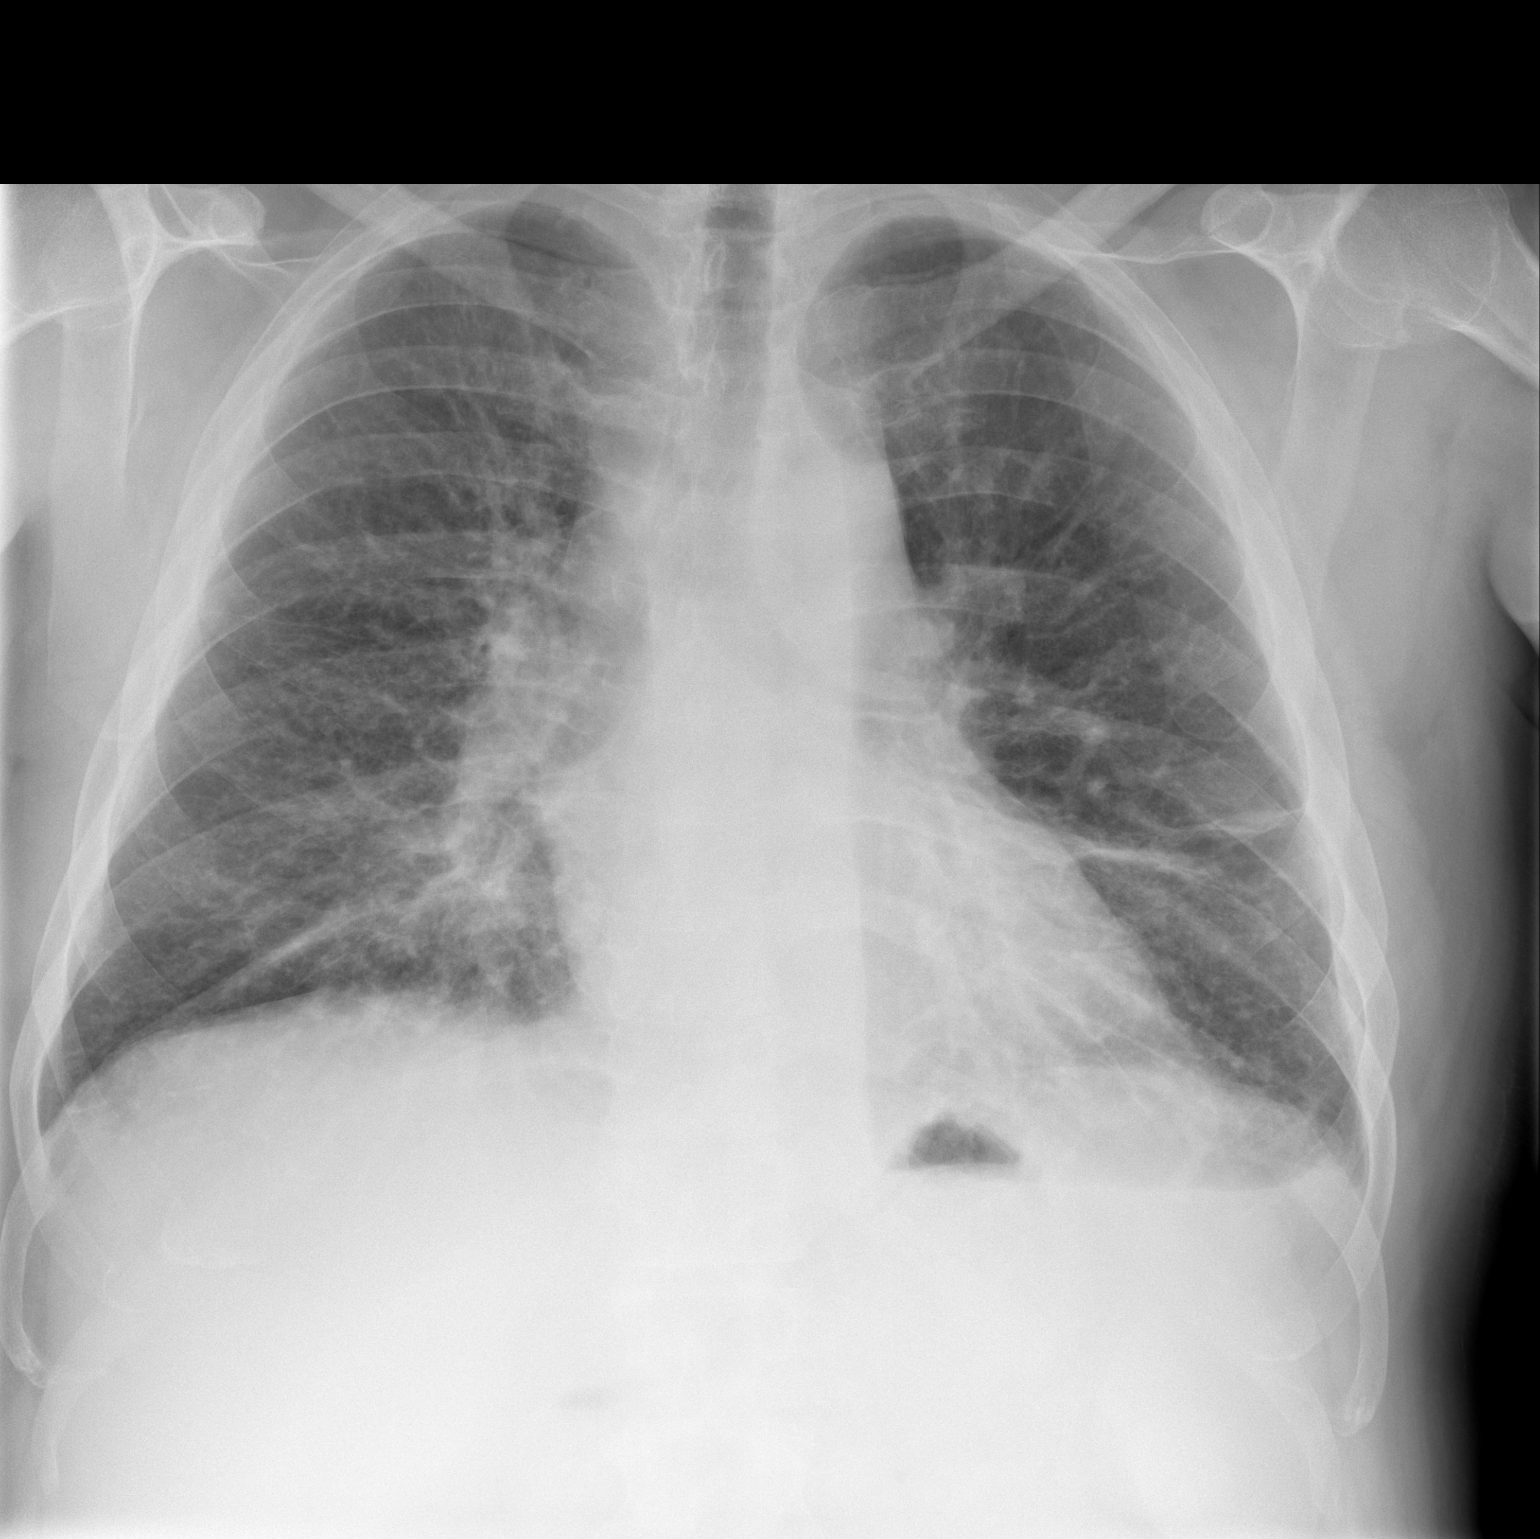

[w chest lat]
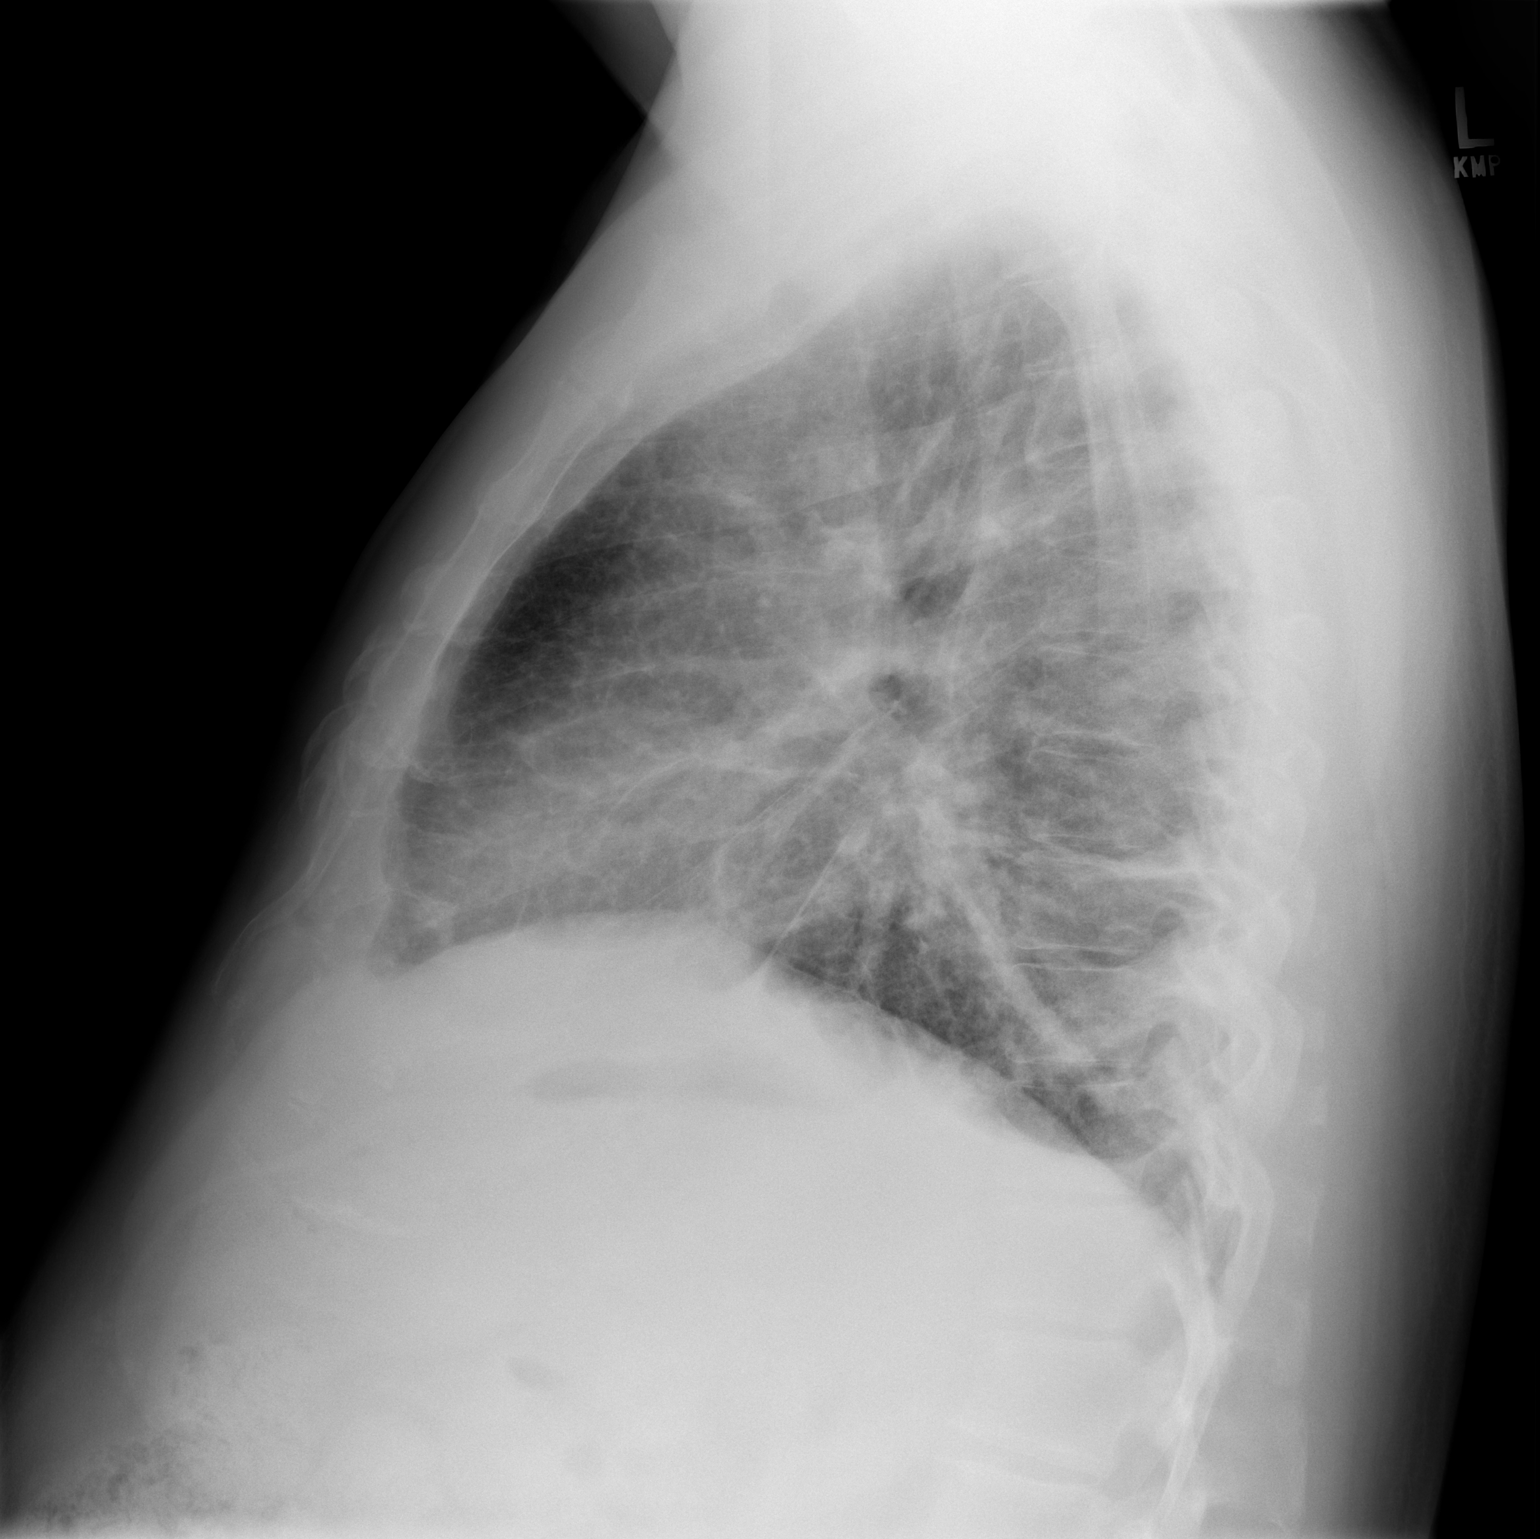

[2 of 2 positions shown; findings below may reference images not displayed]

FINDINGS: Lung volumes are low. Diffuse peribronchial cuffing. Diffuse
interstitial prominence. Linear opacities throughout the mid to
lower lungs bilaterally. No confluent consolidation. Trace left
pleural effusion. No evidence of pulmonary edema. Heart size is
normal. Upper mediastinal contours are within normal limits.
IMPRESSION: 1. The appearance the chest is concerning for bronchitis,
potentially with mild or resolving multilobar bronchopneumonia.
Followup PA and lateral chest X-ray is recommended in 3-4 weeks
following trial of antibiotic therapy to ensure resolution and
exclude underlying malignancy. Should these findings fail to resolve
on the followup, further evaluation with high-resolution chest CT
would be appropriate to better evaluate these findings.
2. Trace left pleural effusion.

## 2019-07-30 DIAGNOSIS — I5032 Chronic diastolic (congestive) heart failure: Secondary | ICD-10-CM | POA: Diagnosis present

## 2022-07-17 ENCOUNTER — Emergency Department (HOSPITAL_COMMUNITY): Payer: No Typology Code available for payment source

## 2022-07-17 ENCOUNTER — Inpatient Hospital Stay (HOSPITAL_COMMUNITY)
Admission: EM | Admit: 2022-07-17 | Discharge: 2022-07-21 | DRG: 871 | Disposition: A | Discharge status: Home / Self Care | Payer: No Typology Code available for payment source | Attending: Internal Medicine | Admitting: Internal Medicine

## 2022-07-17 ENCOUNTER — Inpatient Hospital Stay (HOSPITAL_COMMUNITY): Payer: No Typology Code available for payment source

## 2022-07-17 ENCOUNTER — Encounter (HOSPITAL_COMMUNITY): Payer: Self-pay | Admitting: Emergency Medicine

## 2022-07-17 ENCOUNTER — Other Ambulatory Visit: Payer: Self-pay

## 2022-07-17 DIAGNOSIS — I5020 Unspecified systolic (congestive) heart failure: Secondary | ICD-10-CM | POA: Diagnosis not present

## 2022-07-17 DIAGNOSIS — E1140 Type 2 diabetes mellitus with diabetic neuropathy, unspecified: Secondary | ICD-10-CM | POA: Diagnosis present

## 2022-07-17 DIAGNOSIS — Z8616 Personal history of COVID-19: Secondary | ICD-10-CM

## 2022-07-17 DIAGNOSIS — I878 Other specified disorders of veins: Secondary | ICD-10-CM | POA: Diagnosis present

## 2022-07-17 DIAGNOSIS — Z8249 Family history of ischemic heart disease and other diseases of the circulatory system: Secondary | ICD-10-CM

## 2022-07-17 DIAGNOSIS — I5032 Chronic diastolic (congestive) heart failure: Secondary | ICD-10-CM | POA: Diagnosis not present

## 2022-07-17 DIAGNOSIS — R042 Hemoptysis: Secondary | ICD-10-CM | POA: Diagnosis present

## 2022-07-17 DIAGNOSIS — A419 Sepsis, unspecified organism: Secondary | ICD-10-CM | POA: Diagnosis present

## 2022-07-17 DIAGNOSIS — E1165 Type 2 diabetes mellitus with hyperglycemia: Secondary | ICD-10-CM | POA: Diagnosis present

## 2022-07-17 DIAGNOSIS — I10 Essential (primary) hypertension: Secondary | ICD-10-CM | POA: Diagnosis present

## 2022-07-17 DIAGNOSIS — R9389 Abnormal findings on diagnostic imaging of other specified body structures: Secondary | ICD-10-CM

## 2022-07-17 DIAGNOSIS — Z794 Long term (current) use of insulin: Secondary | ICD-10-CM | POA: Diagnosis not present

## 2022-07-17 DIAGNOSIS — Z7984 Long term (current) use of oral hypoglycemic drugs: Secondary | ICD-10-CM

## 2022-07-17 DIAGNOSIS — Z1152 Encounter for screening for COVID-19: Secondary | ICD-10-CM

## 2022-07-17 DIAGNOSIS — I959 Hypotension, unspecified: Secondary | ICD-10-CM | POA: Diagnosis present

## 2022-07-17 DIAGNOSIS — E785 Hyperlipidemia, unspecified: Secondary | ICD-10-CM | POA: Diagnosis present

## 2022-07-17 DIAGNOSIS — K219 Gastro-esophageal reflux disease without esophagitis: Secondary | ICD-10-CM | POA: Diagnosis present

## 2022-07-17 DIAGNOSIS — J45901 Unspecified asthma with (acute) exacerbation: Secondary | ICD-10-CM | POA: Insufficient documentation

## 2022-07-17 DIAGNOSIS — R319 Hematuria, unspecified: Secondary | ICD-10-CM

## 2022-07-17 DIAGNOSIS — Z833 Family history of diabetes mellitus: Secondary | ICD-10-CM

## 2022-07-17 DIAGNOSIS — I4891 Unspecified atrial fibrillation: Secondary | ICD-10-CM | POA: Diagnosis not present

## 2022-07-17 DIAGNOSIS — Z79899 Other long term (current) drug therapy: Secondary | ICD-10-CM

## 2022-07-17 DIAGNOSIS — J189 Pneumonia, unspecified organism: Secondary | ICD-10-CM

## 2022-07-17 DIAGNOSIS — Z8701 Personal history of pneumonia (recurrent): Secondary | ICD-10-CM

## 2022-07-17 DIAGNOSIS — N1831 Chronic kidney disease, stage 3a: Secondary | ICD-10-CM | POA: Diagnosis present

## 2022-07-17 DIAGNOSIS — N390 Urinary tract infection, site not specified: Secondary | ICD-10-CM | POA: Diagnosis not present

## 2022-07-17 DIAGNOSIS — E119 Type 2 diabetes mellitus without complications: Secondary | ICD-10-CM

## 2022-07-17 DIAGNOSIS — I480 Paroxysmal atrial fibrillation: Secondary | ICD-10-CM | POA: Diagnosis not present

## 2022-07-17 DIAGNOSIS — I5043 Acute on chronic combined systolic (congestive) and diastolic (congestive) heart failure: Secondary | ICD-10-CM | POA: Diagnosis present

## 2022-07-17 DIAGNOSIS — Z6832 Body mass index (BMI) 32.0-32.9, adult: Secondary | ICD-10-CM

## 2022-07-17 DIAGNOSIS — J9621 Acute and chronic respiratory failure with hypoxia: Secondary | ICD-10-CM | POA: Diagnosis not present

## 2022-07-17 DIAGNOSIS — Z9049 Acquired absence of other specified parts of digestive tract: Secondary | ICD-10-CM

## 2022-07-17 DIAGNOSIS — E669 Obesity, unspecified: Secondary | ICD-10-CM | POA: Diagnosis present

## 2022-07-17 DIAGNOSIS — G4733 Obstructive sleep apnea (adult) (pediatric): Secondary | ICD-10-CM | POA: Diagnosis present

## 2022-07-17 DIAGNOSIS — J850 Gangrene and necrosis of lung: Secondary | ICD-10-CM | POA: Diagnosis present

## 2022-07-17 DIAGNOSIS — N3001 Acute cystitis with hematuria: Secondary | ICD-10-CM

## 2022-07-17 DIAGNOSIS — R791 Abnormal coagulation profile: Secondary | ICD-10-CM | POA: Diagnosis present

## 2022-07-17 DIAGNOSIS — J90 Pleural effusion, not elsewhere classified: Secondary | ICD-10-CM | POA: Diagnosis present

## 2022-07-17 DIAGNOSIS — K7460 Unspecified cirrhosis of liver: Secondary | ICD-10-CM | POA: Diagnosis present

## 2022-07-17 DIAGNOSIS — I130 Hypertensive heart and chronic kidney disease with heart failure and stage 1 through stage 4 chronic kidney disease, or unspecified chronic kidney disease: Secondary | ICD-10-CM | POA: Diagnosis present

## 2022-07-17 DIAGNOSIS — M1990 Unspecified osteoarthritis, unspecified site: Secondary | ICD-10-CM | POA: Diagnosis present

## 2022-07-17 DIAGNOSIS — I358 Other nonrheumatic aortic valve disorders: Secondary | ICD-10-CM | POA: Diagnosis present

## 2022-07-17 DIAGNOSIS — E1122 Type 2 diabetes mellitus with diabetic chronic kidney disease: Secondary | ICD-10-CM | POA: Diagnosis present

## 2022-07-17 DIAGNOSIS — N179 Acute kidney failure, unspecified: Secondary | ICD-10-CM | POA: Diagnosis present

## 2022-07-17 DIAGNOSIS — J188 Other pneumonia, unspecified organism: Secondary | ICD-10-CM | POA: Diagnosis present

## 2022-07-17 DIAGNOSIS — R161 Splenomegaly, not elsewhere classified: Secondary | ICD-10-CM

## 2022-07-17 DIAGNOSIS — R590 Localized enlarged lymph nodes: Secondary | ICD-10-CM | POA: Diagnosis present

## 2022-07-17 DIAGNOSIS — E871 Hypo-osmolality and hyponatremia: Secondary | ICD-10-CM | POA: Diagnosis present

## 2022-07-17 DIAGNOSIS — Z825 Family history of asthma and other chronic lower respiratory diseases: Secondary | ICD-10-CM

## 2022-07-17 DIAGNOSIS — Z9981 Dependence on supplemental oxygen: Secondary | ICD-10-CM

## 2022-07-17 DIAGNOSIS — J9601 Acute respiratory failure with hypoxia: Secondary | ICD-10-CM

## 2022-07-17 HISTORY — DX: Chronic diastolic (congestive) heart failure: I50.32

## 2022-07-17 LAB — URINALYSIS, W/ REFLEX TO CULTURE (INFECTION SUSPECTED)
Bilirubin Urine: NEGATIVE
Cellular Cast, UA: 1
Glucose, UA: >=500 mg/dL — AB
Ketones, ur: NEGATIVE mg/dL
Nitrite: NEGATIVE
Protein, ur: NEGATIVE mg/dL
Specific Gravity, Urine: 1.013 (ref 1.005–1.030)
pH: 5 (ref 5.0–8.0)

## 2022-07-17 LAB — CBC WITH DIFFERENTIAL/PLATELET
Abs Immature Granulocytes: 0 10*3/uL (ref 0.00–0.07)
Basophils Absolute: 0 10*3/uL (ref 0.0–0.1)
Basophils Relative: 0 %
Eosinophils Absolute: 0 10*3/uL (ref 0.0–0.5)
Eosinophils Relative: 0 %
HCT: 42.3 % (ref 39.0–52.0)
Hemoglobin: 13.3 g/dL (ref 13.0–17.0)
Lymphocytes Relative: 1 %
Lymphs Abs: 0.2 10*3/uL — ABNORMAL LOW (ref 0.7–4.0)
MCH: 26.7 pg (ref 26.0–34.0)
MCHC: 31.4 g/dL (ref 30.0–36.0)
MCV: 84.8 fL (ref 80.0–100.0)
Monocytes Absolute: 1.3 10*3/uL — ABNORMAL HIGH (ref 0.1–1.0)
Monocytes Relative: 8 %
Neutro Abs: 14.9 10*3/uL — ABNORMAL HIGH (ref 1.7–7.7)
Neutrophils Relative %: 91 %
Platelets: 180 10*3/uL (ref 150–400)
RBC: 4.99 MIL/uL (ref 4.22–5.81)
RDW: 17.9 % — ABNORMAL HIGH (ref 11.5–15.5)
WBC: 16.4 10*3/uL — ABNORMAL HIGH (ref 4.0–10.5)
nRBC: 0 % (ref 0.0–0.2)
nRBC: 0 /100 WBC

## 2022-07-17 LAB — HEPATIC FUNCTION PANEL
ALT: 29 U/L (ref 0–44)
AST: 29 U/L (ref 15–41)
Albumin: 2.4 g/dL — ABNORMAL LOW (ref 3.5–5.0)
Alkaline Phosphatase: 63 U/L (ref 38–126)
Bilirubin, Direct: 0.6 mg/dL — ABNORMAL HIGH (ref 0.0–0.2)
Indirect Bilirubin: 0.7 mg/dL (ref 0.3–0.9)
Total Bilirubin: 1.3 mg/dL — ABNORMAL HIGH (ref 0.3–1.2)
Total Protein: 5.8 g/dL — ABNORMAL LOW (ref 6.5–8.1)

## 2022-07-17 LAB — HEMOGLOBIN A1C
Hgb A1c MFr Bld: 7.6 % — ABNORMAL HIGH (ref 4.8–5.6)
Mean Plasma Glucose: 171.42 mg/dL

## 2022-07-17 LAB — TROPONIN I (HIGH SENSITIVITY)
Troponin I (High Sensitivity): 5 ng/L (ref <18)
Troponin I (High Sensitivity): 7 ng/L (ref <18)

## 2022-07-17 LAB — PROCALCITONIN: Procalcitonin: 7.09 ng/mL

## 2022-07-17 LAB — COMPREHENSIVE METABOLIC PANEL
ALT: 25 U/L (ref 0–44)
AST: 24 U/L (ref 15–41)
Albumin: 2.7 g/dL — ABNORMAL LOW (ref 3.5–5.0)
Alkaline Phosphatase: 82 U/L (ref 38–126)
Anion gap: 13 (ref 5–15)
BUN: 59 mg/dL — ABNORMAL HIGH (ref 8–23)
CO2: 24 mmol/L (ref 22–32)
Calcium: 7.8 mg/dL — ABNORMAL LOW (ref 8.9–10.3)
Chloride: 94 mmol/L — ABNORMAL LOW (ref 98–111)
Creatinine, Ser: 2.31 mg/dL — ABNORMAL HIGH (ref 0.61–1.24)
GFR, Estimated: 31 mL/min — ABNORMAL LOW (ref >60)
Glucose, Bld: 191 mg/dL — ABNORMAL HIGH (ref 70–99)
Potassium: 4.2 mmol/L (ref 3.5–5.1)
Sodium: 131 mmol/L — ABNORMAL LOW (ref 135–145)
Total Bilirubin: 1.9 mg/dL — ABNORMAL HIGH (ref 0.3–1.2)
Total Protein: 6.3 g/dL — ABNORMAL LOW (ref 6.5–8.1)

## 2022-07-17 LAB — HIV ANTIBODY (ROUTINE TESTING W REFLEX): HIV Screen 4th Generation wRfx: NONREACTIVE

## 2022-07-17 LAB — RESP PANEL BY RT-PCR (RSV, FLU A&B, COVID)  RVPGX2
Influenza A by PCR: NEGATIVE
Influenza B by PCR: NEGATIVE
Resp Syncytial Virus by PCR: NEGATIVE
SARS Coronavirus 2 by RT PCR: NEGATIVE

## 2022-07-17 LAB — APTT: aPTT: 29 seconds (ref 24–36)

## 2022-07-17 LAB — PROTIME-INR
INR: 1.6 — ABNORMAL HIGH (ref 0.8–1.2)
Prothrombin Time: 18.8 seconds — ABNORMAL HIGH (ref 11.4–15.2)

## 2022-07-17 LAB — BRAIN NATRIURETIC PEPTIDE: B Natriuretic Peptide: 119.8 pg/mL — ABNORMAL HIGH (ref 0.0–100.0)

## 2022-07-17 LAB — GLUCOSE, CAPILLARY
Glucose-Capillary: 276 mg/dL — ABNORMAL HIGH (ref 70–99)
Glucose-Capillary: 283 mg/dL — ABNORMAL HIGH (ref 70–99)

## 2022-07-17 LAB — LACTIC ACID, PLASMA: Lactic Acid, Venous: 1.7 mmol/L (ref 0.5–1.9)

## 2022-07-17 MED ORDER — ALBUTEROL SULFATE (2.5 MG/3ML) 0.083% IN NEBU
2.5000 mg | INHALATION_SOLUTION | RESPIRATORY_TRACT | Status: DC | PRN
  Administered 2022-07-17: 2.5 mg via RESPIRATORY_TRACT
  Filled 2022-07-17: qty 3

## 2022-07-17 MED ORDER — INSULIN GLARGINE-YFGN 100 UNIT/ML ~~LOC~~ SOLN
30.0000 [IU] | Freq: Every day | SUBCUTANEOUS | Status: DC
  Administered 2022-07-17: 30 [IU] via SUBCUTANEOUS
  Filled 2022-07-17 (×2): qty 0.3

## 2022-07-17 MED ORDER — SODIUM CHLORIDE 0.9% FLUSH
3.0000 mL | Freq: Two times a day (BID) | INTRAVENOUS | Status: DC
  Administered 2022-07-17 – 2022-07-21 (×9): 3 mL via INTRAVENOUS

## 2022-07-17 MED ORDER — LACTATED RINGERS IV BOLUS (SEPSIS)
1000.0000 mL | Freq: Once | INTRAVENOUS | Status: AC
  Administered 2022-07-17: 1000 mL via INTRAVENOUS

## 2022-07-17 MED ORDER — MAGNESIUM SULFATE 2 GM/50ML IV SOLN
2.0000 g | Freq: Once | INTRAVENOUS | Status: AC
  Administered 2022-07-17: 2 g via INTRAVENOUS
  Filled 2022-07-17: qty 50

## 2022-07-17 MED ORDER — ACETAMINOPHEN 325 MG PO TABS
650.0000 mg | ORAL_TABLET | Freq: Four times a day (QID) | ORAL | Status: DC | PRN
  Administered 2022-07-17 – 2022-07-18 (×2): 650 mg via ORAL
  Filled 2022-07-17 (×2): qty 2

## 2022-07-17 MED ORDER — SODIUM CHLORIDE 0.9 % IV SOLN
500.0000 mg | INTRAVENOUS | Status: DC
  Administered 2022-07-18 – 2022-07-21 (×4): 500 mg via INTRAVENOUS
  Filled 2022-07-17 (×4): qty 5

## 2022-07-17 MED ORDER — SODIUM CHLORIDE 0.9 % IV SOLN
500.0000 mg | Freq: Once | INTRAVENOUS | Status: AC
  Administered 2022-07-17: 500 mg via INTRAVENOUS
  Filled 2022-07-17: qty 5

## 2022-07-17 MED ORDER — POLYETHYLENE GLYCOL 3350 17 G PO PACK: 17.0000 g | PACK | Freq: Every day | ORAL | Status: DC | PRN

## 2022-07-17 MED ORDER — ALBUTEROL SULFATE (2.5 MG/3ML) 0.083% IN NEBU
2.5000 mg | INHALATION_SOLUTION | Freq: Once | RESPIRATORY_TRACT | Status: AC
  Administered 2022-07-17: 2.5 mg via RESPIRATORY_TRACT
  Filled 2022-07-17: qty 3

## 2022-07-17 MED ORDER — INSULIN ASPART 100 UNIT/ML IJ SOLN
0.0000 [IU] | Freq: Three times a day (TID) | INTRAMUSCULAR | Status: DC
  Administered 2022-07-17: 8 [IU] via SUBCUTANEOUS
  Administered 2022-07-18: 3 [IU] via SUBCUTANEOUS
  Administered 2022-07-18: 8 [IU] via SUBCUTANEOUS
  Administered 2022-07-18 – 2022-07-19 (×3): 5 [IU] via SUBCUTANEOUS
  Administered 2022-07-19: 3 [IU] via SUBCUTANEOUS
  Administered 2022-07-20: 8 [IU] via SUBCUTANEOUS
  Administered 2022-07-21: 2 [IU] via SUBCUTANEOUS

## 2022-07-17 MED ORDER — IPRATROPIUM BROMIDE 0.02 % IN SOLN
0.5000 mg | Freq: Four times a day (QID) | RESPIRATORY_TRACT | Status: DC
  Administered 2022-07-17 – 2022-07-21 (×12): 0.5 mg via RESPIRATORY_TRACT
  Filled 2022-07-17 (×14): qty 2.5

## 2022-07-17 MED ORDER — VANCOMYCIN HCL 2000 MG/400ML IV SOLN
2000.0000 mg | Freq: Once | INTRAVENOUS | Status: AC
  Administered 2022-07-17: 2000 mg via INTRAVENOUS
  Filled 2022-07-17 (×3): qty 400

## 2022-07-17 MED ORDER — LACTATED RINGERS IV SOLN: INTRAVENOUS | Status: AC

## 2022-07-17 MED ORDER — DIPHENHYDRAMINE HCL 25 MG PO CAPS
50.0000 mg | ORAL_CAPSULE | Freq: Every evening | ORAL | Status: DC | PRN
  Administered 2022-07-17 – 2022-07-19 (×3): 50 mg via ORAL
  Filled 2022-07-17 (×3): qty 2

## 2022-07-17 MED ORDER — SODIUM CHLORIDE 0.9 % IV SOLN
2.0000 g | INTRAVENOUS | Status: DC
  Administered 2022-07-18 – 2022-07-19 (×2): 2 g via INTRAVENOUS
  Filled 2022-07-17 (×2): qty 20

## 2022-07-17 MED ORDER — METHYLPREDNISOLONE SODIUM SUCC 125 MG IJ SOLR
125.0000 mg | Freq: Once | INTRAMUSCULAR | Status: AC
  Administered 2022-07-17: 125 mg via INTRAVENOUS
  Filled 2022-07-17: qty 2

## 2022-07-17 MED ORDER — LACTATED RINGERS IV BOLUS (SEPSIS): 1000.0000 mL | Freq: Once | INTRAVENOUS | Status: DC

## 2022-07-17 MED ORDER — VANCOMYCIN VARIABLE DOSE PER UNSTABLE RENAL FUNCTION (PHARMACIST DOSING): Status: DC

## 2022-07-17 MED ORDER — INSULIN ASPART 100 UNIT/ML IJ SOLN
20.0000 [IU] | Freq: Three times a day (TID) | INTRAMUSCULAR | Status: DC
  Administered 2022-07-18 – 2022-07-21 (×9): 20 [IU] via SUBCUTANEOUS

## 2022-07-17 MED ORDER — ENOXAPARIN SODIUM 40 MG/0.4ML IJ SOSY
40.0000 mg | PREFILLED_SYRINGE | INTRAMUSCULAR | Status: DC
  Administered 2022-07-17 – 2022-07-18 (×2): 40 mg via SUBCUTANEOUS
  Filled 2022-07-17 (×3): qty 0.4

## 2022-07-17 MED ORDER — SODIUM CHLORIDE 0.9 % IV SOLN
2.0000 g | Freq: Once | INTRAVENOUS | Status: AC
  Administered 2022-07-17: 2 g via INTRAVENOUS
  Filled 2022-07-17: qty 20

## 2022-07-17 MED ORDER — ACETAMINOPHEN 650 MG RE SUPP: 650.0000 mg | Freq: Four times a day (QID) | RECTAL | Status: DC | PRN

## 2022-07-17 NOTE — ED Triage Notes (Signed)
Pt BIB EMS from PCP office, pt c/o fever, SOB and burning with urination since Monday. Pt SpO2 75% on room air at PCP office, wears 3L via [place] at night, placed on 4L at PCP office, SpO2 95% on 4L. Hx asthma, pna. Rhonchi and wheezing throughout. Given 1 duoneb. Initial BP 80/60, given 24mL NS, BP then 110/60. A/ox4 on arrival.

## 2022-07-17 NOTE — Progress Notes (Signed)
Elink following code sepsis

## 2022-07-17 NOTE — Progress Notes (Signed)
Pt placed on 14 lpm HFNC per order for HFNC.  Pt appears to be tol well, slight tachypnea w/ rr 30-32 bpm.  Per pt- he denies SOB and states he is breathing well on HFNC.

## 2022-07-17 NOTE — ED Provider Notes (Signed)
[place] [facility] AT [facility] Provider Note   CSN: [id number] Arrival date & time: 07/17/22  1033     History  Chief Complaint  Patient presents with   Shortness of Breath    Hunter Lewis is a 67 y.o. male.  Patient is a 67 year old male with past medical history of asthma presenting from primary care physician office for fever, shortness of breath, cough, and decreased oxygen saturation to 75%.  Patient admits to fever, shortness of breath, and cough for several days.  Admits to asthma exacerbation flared by upper respiratory illness with increased wheezing over the past several days.  Has a nebulizer at home and used albuterol nebulized treatment once last night.  States he had minimal improvement due to difficulty inhaling and chest tightness.  Patient also admits to nausea, vomiting, and dysuria.  Denies any diarrhea or abdominal pain.  Denies sick contacts.  Denies prior intubations for asthma exacerbations.  History positive for pneumonia last year.  No recent antibiotic or steroid use. Wears nightly oxygen at 3L for OSA.   The history is provided by the patient. No language interpreter was used.  Shortness of Breath Associated symptoms: cough and fever   Associated symptoms: no abdominal pain, no chest pain, no ear pain, no rash, no sore throat and no vomiting        Home Medications Prior to Admission medications   Medication Sig Start Date End Date Taking? Authorizing Provider  albuterol (PROVENTIL HFA;VENTOLIN HFA) 108 (90 Base) MCG/ACT inhaler Inhale 2 puffs into the lungs every 4 (four) hours as needed for wheezing or shortness of breath.    [provider]  albuterol (PROVENTIL) (2.5 MG/3ML) 0.083% nebulizer solution Take 2.5 mg by nebulization every 6 (six) hours as needed for wheezing or shortness of breath.    [provider]  amLODipine (NORVASC) 10 MG tablet Take 10 mg by mouth daily.    [provider]   fenofibrate 160 MG tablet Take 160 mg by mouth daily.    [provider]  furosemide (LASIX) 40 MG tablet Take 40 mg by mouth daily.    [provider]  HYDROcodone-acetaminophen (NORCO/VICODIN) 5-325 MG tablet Take 1 tablet by mouth every 6 (six) hours as needed for moderate pain. 03/16/17   Jimmye Norman, MD  Insulin Glargine (BASAGLAR KWIKPEN) 100 UNIT/ML SOPN Inject 30 Units into the skin at bedtime.    [provider]  Insulin Lispro (HUMALOG KWIKPEN) 200 UNIT/ML SOPN Inject 45 Units into the skin 3 (three) times daily with meals.    [provider]  lisinopril-hydrochlorothiazide (PRINZIDE,ZESTORETIC) 20-12.5 MG tablet Take 1 tablet by mouth daily.    [provider]  metFORMIN (GLUCOPHAGE) 1000 MG tablet Take 1,000 mg by mouth daily with breakfast.    [provider]  metoprolol succinate (TOPROL-XL) 50 MG 24 hr tablet Take 1 tablet by mouth daily. 12/28/16   [provider]      Allergies    Patient has no known allergies.    Review of Systems   Review of Systems  Constitutional:  Positive for chills and fever.  HENT:  Negative for ear pain and sore throat.   Eyes:  Negative for pain and visual disturbance.  Respiratory:  Positive for cough and shortness of breath.   Cardiovascular:  Negative for chest pain and palpitations.  Gastrointestinal:  Negative for abdominal pain and vomiting.  Genitourinary:  Positive for dysuria. Negative for hematuria.  Musculoskeletal:  Negative for  arthralgias and back pain.  Skin:  Negative for color change and rash.  Neurological:  Negative for seizures and syncope.  All other systems reviewed and are negative.   Physical Exam Updated Vital Signs BP 97/81   Pulse 89   Temp 98 F (36.7 C) (Oral)   Resp (!) 25   Ht 6' (1.829 m)   Wt 109.8 kg   SpO2 96%   BMI 32.82 kg/m  Physical Exam Vitals and nursing note reviewed.  Constitutional:      General: He is not in acute  distress.    Appearance: He is well-developed.  HENT:     Head: Normocephalic and atraumatic.  Eyes:     Conjunctiva/sclera: Conjunctivae normal.  Cardiovascular:     Rate and Rhythm: Normal rate and regular rhythm.     Heart sounds: No murmur heard. Pulmonary:     Effort: Tachypnea and respiratory distress present.     Breath sounds: Examination of the right-upper field reveals wheezing. Examination of the left-upper field reveals wheezing. Examination of the right-middle field reveals wheezing. Examination of the left-middle field reveals wheezing. Examination of the right-lower field reveals wheezing. Examination of the left-lower field reveals wheezing. Wheezing present.  Abdominal:     Palpations: Abdomen is soft.     Tenderness: There is no abdominal tenderness.  Musculoskeletal:        General: No swelling.     Cervical back: Neck supple.  Skin:    General: Skin is warm and dry.     Capillary Refill: Capillary refill takes less than 2 seconds.  Neurological:     Mental Status: He is alert and oriented to person, place, and time.     GCS: GCS eye subscore is 4. GCS verbal subscore is 5. GCS motor subscore is 6.  Psychiatric:        Mood and Affect: Mood normal.     ED Results / Procedures / Treatments   Labs (all labs ordered are listed, but only abnormal results are displayed) Labs Reviewed  COMPREHENSIVE METABOLIC PANEL - Abnormal; Notable for the following components:      Result Value   Sodium 131 (*)    Chloride 94 (*)    Glucose, Bld 191 (*)    BUN 59 (*)    Creatinine, Ser 2.31 (*)    Calcium 7.8 (*)    Total Protein 6.3 (*)    Albumin 2.7 (*)    Total Bilirubin 1.9 (*)    GFR, Estimated 31 (*)    All other components within normal limits  CBC WITH DIFFERENTIAL/PLATELET - Abnormal; Notable for the following components:   WBC 16.4 (*)    RDW 17.9 (*)    Neutro Abs 14.9 (*)    Lymphs Abs 0.2 (*)    Monocytes Absolute 1.3 (*)    All other components  within normal limits  PROTIME-INR - Abnormal; Notable for the following components:   Prothrombin Time 18.8 (*)    INR 1.6 (*)    All other components within normal limits  URINALYSIS, W/ REFLEX TO CULTURE (INFECTION SUSPECTED) - Abnormal; Notable for the following components:   APPearance HAZY (*)    Glucose, UA >=500 (*)    Hgb urine dipstick SMALL (*)    Leukocytes,Ua MODERATE (*)    Bacteria, UA RARE (*)    All other components within normal limits  RESP PANEL BY RT-PCR (RSV, FLU A&B, COVID)  RVPGX2  CULTURE, BLOOD (ROUTINE X 2)  CULTURE,  BLOOD (ROUTINE X 2)  URINE CULTURE  LACTIC ACID, PLASMA  APTT  TROPONIN I (HIGH SENSITIVITY)  TROPONIN I (HIGH SENSITIVITY)    EKG None  Radiology CT CHEST WO CONTRAST  Result Date: 07/17/2022 CLINICAL DATA:  Respiratory illness, fever, shortness of breath, hypoxia EXAM: CT CHEST WITHOUT CONTRAST TECHNIQUE: Multidetector CT imaging of the chest was performed following the standard protocol without IV contrast. RADIATION DOSE REDUCTION: This exam was performed according to the departmental dose-optimization program which includes automated exposure control, adjustment of the mA and/or kV according to patient size and/or use of iterative reconstruction technique. COMPARISON:  Chest radiographs, 07/17/2022, CT chest, 11/26/2019 FINDINGS: Cardiovascular: No significant vascular findings. Normal heart size. No pericardial effusion. Mediastinum/Nodes: Numerous generally unchanged prominent and enlarged lymph nodes throughout the mediastinum and hila, index prevascular node measuring 1.7 x 1.3 cm (series 3, image 56). Thyroid gland, trachea, and esophagus demonstrate no significant findings. Lungs/Pleura: Frothy debris throughout the left mainstem bronchus and left lower lobe airways. Very extensive heterogeneous and consolidative airspace opacity throughout the lungs, more conspicuous and dense in the left lung although also seen in the right lung base.  Masslike, cavitating appearance of the left lower lobe, this region measuring approximately 9.3 x 6.9 cm (series 3, image 101). Chronic, loculated small left pleural effusion, similar to prior examination, with associated pleural thickening. Chronic scarring at the lung bases, diffuse bilateral bronchial wall thickening, and interlobular septal thickening, similar to prior examination. Upper Abdomen: No acute abnormality. Coarse contour of the liver.  Partially imaged splenomegaly. Musculoskeletal: No chest wall abnormality. No acute osseous findings. IMPRESSION: 1. Very extensive heterogeneous and consolidative airspace opacity throughout the lungs, more conspicuous and dense in the left lung although also seen in the right lung base. 2. Masslike, cavitating appearance of the left lower lobe, this region measuring approximately 9.3 x 6.9 cm. 3. Findings are most suggestive of sequelae of aspiration resulting in necrotizing pneumonia. 4. Chronic, loculated small left pleural effusion, similar to prior examination, with associated pleural thickening. 5. Chronic scarring at the lung bases, diffuse bilateral bronchial wall thickening, and interlobular septal thickening, similar to prior examination. Findings most likely reflect a combination of chronic sequelae of prior infection or aspiration with superimposed pulmonary edema. 6. Numerous generally unchanged prominent and enlarged lymph nodes throughout the mediastinum and hila, likely reactive. 7. Coarse contour of the liver, suggestive of cirrhosis. Partially imaged splenomegaly. Electronically Signed   By: Delanna Ahmadi M.D.   On: 07/17/2022 14:33   DG Chest Port 1 View  Result Date: 07/17/2022 CLINICAL DATA:  Questionable sepsis - evaluate for abnormality EXAM: PORTABLE CHEST 1 VIEW COMPARISON:  CT 07/01/2021, CT 11/26/2019, radiograph 08/03/2019 FINDINGS: Unchanged cardiomediastinal silhouette. There is left mid to lower lung airspace disease and a small left  pleural effusion. There is also right lower lung airspace disease and diffuse interstitial prominence. No evidence of pneumothorax. No acute osseous abnormality. IMPRESSION: Diffuse interstitial opacities with airspace disease throughout the left lung and in the right lower lung, concerning for pulmonary edema and/or multifocal pneumonia. Chronic small left pleural effusion with pleural thickening as seen on recent and prior CTs. Electronically Signed   By: Maurine Simmering M.D.   On: 07/17/2022 11:31    Procedures .Critical Care  Performed by: Lianne Cure, DO Authorized by: Lianne Cure, DO   Critical care provider statement:    Critical care time (minutes):  76   Critical care was necessary to treat or prevent imminent or life-threatening  deterioration of the following conditions:  Sepsis and respiratory failure   Critical care was time spent personally by me on the following activities:  Development of treatment plan with patient or surrogate, discussions with consultants, evaluation of patient's response to treatment, examination of patient, ordering and review of laboratory studies, ordering and review of radiographic studies, ordering and performing treatments and interventions, pulse oximetry, re-evaluation of patient's condition and review of old charts     Medications Ordered in ED Medications  lactated ringers infusion ( Intravenous New Bag/Given 07/17/22 1146)  lactated ringers bolus 1,000 mL (0 mLs Intravenous Stopped 07/17/22 1259)    And  lactated ringers bolus 1,000 mL (1,000 mLs Intravenous New Bag/Given 07/17/22 1345)    And  lactated ringers bolus 1,000 mL (has no administration in time range)  methylPREDNISolone sodium succinate (SOLU-MEDROL) 125 mg/2 mL injection 125 mg (125 mg Intravenous Given 07/17/22 1147)  magnesium sulfate IVPB 2 g 50 mL (0 g Intravenous Stopped 07/17/22 1259)  albuterol (PROVENTIL) (2.5 MG/3ML) 0.083% nebulizer solution 2.5 mg (2.5 mg Nebulization Given  07/17/22 1151)  cefTRIAXone (ROCEPHIN) 2 g in sodium chloride 0.9 % 100 mL IVPB (0 g Intravenous Stopped 07/17/22 1345)  azithromycin (ZITHROMAX) 500 mg in sodium chloride 0.9 % 250 mL IVPB (0 mg Intravenous Stopped 07/17/22 1345)    ED Course/ Medical Decision Making/ A&P                             Medical Decision Making Amount and/or Complexity of Data Reviewed Labs: ordered. Radiology: ordered. ECG/medicine tests: ordered.  Risk Prescription drug management.   9:48 PM  67 year old male with past medical history of asthma presenting from primary care physician office for fever, shortness of breath, cough, and decreased oxygen saturation to 75%.  Patient is alert and oriented x 3, afebrile, tachycardic at 101, tachypneic, and hypoxic on arrival.  Patient currently satting at 93% on 4 L but desaturates to 88% while speaking.  Has already received 1 DuoNeb treatment prior to my evaluation.  Wheezing present throughout bilaterally.  Will repeat albuterol nebulized treatment, give 125 mg of IV Solu-Medrol, and 2 g of magnesium.  Hypotension present.  Current blood pressure 85 systolic, this is after receiving 500 cc fluid bolus and route. Concerns for sepsis.  Code sepsis activated.  Patient also complains of urinary symptoms.  Will initiate treatment pending results.  Labs, PCRs, and imaging pending.  Chest x-ray concerning for pneumonia versus pleural effusion.  Rocephin and azithromycin started.  CT chest noncontrast (low GFR) pending.  Urinalysis positive for urinary tract infection.  Rocephin will cover.  Culture sent.  3:12 PM CT chest demonstrates: 1. Very extensive heterogeneous and consolidative airspace opacity throughout the lungs, more conspicuous and dense in the left lung although also seen in the right lung base. 2. Masslike, cavitating appearance of the left lower lobe, this region measuring approximately 9.3 x 6.9 cm. 3. Findings are most suggestive of sequelae of  aspiration resulting in necrotizing pneumonia. 4. Chronic, loculated small left pleural effusion, similar to prior examination, with associated pleural thickening. 5. Chronic scarring at the lung bases, diffuse bilateral bronchial wall thickening, and interlobular septal thickening, similar to prior examination. Findings most likely reflect a combination of chronic sequelae of prior infection or aspiration with superimposed pulmonary edema. 6. Numerous generally unchanged prominent and enlarged lymph nodes throughout the mediastinum and hila, likely reactive. 7. Coarse contour of the liver, suggestive of cirrhosis.  Partially imaged splenomegaly.  Vanc added.  Patient accepted by admitting provider.   3:12 PM Nursing update, patient hypoxia on 5 L. Will increase to high flow.         Final Clinical Impression(s) / ED Diagnoses Final diagnoses:  Exacerbation of asthma, unspecified asthma severity, unspecified whether persistent  Acute respiratory failure with hypoxia (HCC)  Sepsis, due to unspecified organism, unspecified whether acute organ dysfunction present ([place])  Pneumonia due to infectious organism, unspecified laterality, unspecified part of lung  AKI (acute kidney injury) ([place])  Acute cystitis with hematuria  Splenomegaly  Abnormal CT of the chest  Cirrhosis of liver without ascites, unspecified hepatic cirrhosis type [facility])    Rx / DC Orders ED Discharge Orders     None         Lianne Cure, DO 16/10/96 1512
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
## 2022-07-17 NOTE — Progress Notes (Addendum)
Pharmacy Antibiotic Note  Hunter Lewis is a 67 y.o. male admitted on 07/17/2022 with pneumonia. Pharmacy has been consulted for Vancomycin dosing. CT findings c/w necrotizing pneumonia. WBC elevated at 16.4, afebrile. Scr 2.31, baseline appears to be around 1.3 (last 1.31 08/01/21).   Plan: Vancomycin 2000 mg IV x 1 Dose per random level  Monitor s/sx improvement, C&S, renal function    Height: 6' (182.9 cm) Weight: 109.8 kg (242 lb) IBW/kg (Calculated) : 77.6  Temp (24hrs), Avg:98 F (36.7 C), Min:98 F (36.7 C), Max:98 F (36.7 C)  Recent Labs  Lab 07/17/22 1140  WBC 16.4*  CREATININE 2.31*  LATICACIDVEN 1.7    Estimated Creatinine Clearance: 41.4 mL/min (A) (by C-G formula based on SCr of 2.31 mg/dL (H)).    No Known Allergies  Antimicrobials this admission: 2/1 ceftriaxone x 1  2/1 azithromycin x 1  2/1 vancomycin >>  Dose adjustments this admission:   Microbiology results: 2/1 BCx: sent 2/1 UCx: collected 2/1 RVP: neg 2/1 Sputum Cx: sent  2/1 MRSA PCR: ordered   Thank you for allowing pharmacy to be a part of this patient's care.  Eliseo Gum, PharmD PGY1 Pharmacy Resident   07/17/2022  3:26 PM

## 2022-07-17 NOTE — Progress Notes (Signed)
Notified provider and bedside nurse of need to order and administer antibiotics.
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
## 2022-07-17 NOTE — Progress Notes (Signed)
RN called me to assess pt d/t pt being placed on NRB.  Noted sat 94-95% on NRB, BBSH w/ crackles t/o, no distress currently noted.  Per pt, he has a history of CHF and has had to wear bipap in the past.  Per discussion w/ pt, he states he does not want to wear bipap unless necessary.  We discussed there is a possibility he my need to wear bipap.  This was discussed w/ RN.  Currently no increased WOB noted and pt appears comfortable.
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
## 2022-07-18 ENCOUNTER — Encounter (HOSPITAL_COMMUNITY): Payer: Self-pay | Admitting: Internal Medicine

## 2022-07-18 DIAGNOSIS — J850 Gangrene and necrosis of lung: Secondary | ICD-10-CM

## 2022-07-18 LAB — COMPREHENSIVE METABOLIC PANEL
ALT: 63 U/L — ABNORMAL HIGH (ref 0–44)
AST: 71 U/L — ABNORMAL HIGH (ref 15–41)
Albumin: 2.3 g/dL — ABNORMAL LOW (ref 3.5–5.0)
Alkaline Phosphatase: 66 U/L (ref 38–126)
Anion gap: 12 (ref 5–15)
BUN: 78 mg/dL — ABNORMAL HIGH (ref 8–23)
CO2: 25 mmol/L (ref 22–32)
Calcium: 7.7 mg/dL — ABNORMAL LOW (ref 8.9–10.3)
Chloride: 95 mmol/L — ABNORMAL LOW (ref 98–111)
Creatinine, Ser: 2.35 mg/dL — ABNORMAL HIGH (ref 0.61–1.24)
GFR, Estimated: 30 mL/min — ABNORMAL LOW (ref >60)
Glucose, Bld: 284 mg/dL — ABNORMAL HIGH (ref 70–99)
Potassium: 4 mmol/L (ref 3.5–5.1)
Sodium: 132 mmol/L — ABNORMAL LOW (ref 135–145)
Total Bilirubin: 1 mg/dL (ref 0.3–1.2)
Total Protein: 5.5 g/dL — ABNORMAL LOW (ref 6.5–8.1)

## 2022-07-18 LAB — MRSA NEXT GEN BY PCR, NASAL: MRSA by PCR Next Gen: NOT DETECTED

## 2022-07-18 LAB — GLUCOSE, CAPILLARY
Glucose-Capillary: 287 mg/dL — ABNORMAL HIGH (ref 70–99)
Glucose-Capillary: 242 mg/dL — ABNORMAL HIGH (ref 70–99)
Glucose-Capillary: 200 mg/dL — ABNORMAL HIGH (ref 70–99)
Glucose-Capillary: 164 mg/dL — ABNORMAL HIGH (ref 70–99)

## 2022-07-18 LAB — CBC
HCT: 42 % (ref 39.0–52.0)
Hemoglobin: 13.5 g/dL (ref 13.0–17.0)
MCH: 27.3 pg (ref 26.0–34.0)
MCHC: 32.1 g/dL (ref 30.0–36.0)
MCV: 85 fL (ref 80.0–100.0)
Platelets: 178 10*3/uL (ref 150–400)
RBC: 4.94 MIL/uL (ref 4.22–5.81)
RDW: 17.7 % — ABNORMAL HIGH (ref 11.5–15.5)
WBC: 13.8 10*3/uL — ABNORMAL HIGH (ref 4.0–10.5)
nRBC: 0 % (ref 0.0–0.2)

## 2022-07-18 LAB — URINE CULTURE: Culture: 60000 — AB

## 2022-07-18 LAB — PROTIME-INR
INR: 1.6 — ABNORMAL HIGH (ref 0.8–1.2)
Prothrombin Time: 18.6 seconds — ABNORMAL HIGH (ref 11.4–15.2)

## 2022-07-18 LAB — PROCALCITONIN: Procalcitonin: 7.64 ng/mL

## 2022-07-18 MED ORDER — INSULIN GLARGINE-YFGN 100 UNIT/ML ~~LOC~~ SOLN
35.0000 [IU] | Freq: Every day | SUBCUTANEOUS | Status: DC
  Administered 2022-07-18 – 2022-07-20 (×3): 35 [IU] via SUBCUTANEOUS
  Filled 2022-07-18 (×4): qty 0.35

## 2022-07-18 MED ORDER — METOPROLOL SUCCINATE ER 50 MG PO TB24
100.0000 mg | ORAL_TABLET | Freq: Every day | ORAL | Status: DC
  Administered 2022-07-19 – 2022-07-21 (×3): 100 mg via ORAL
  Filled 2022-07-18 (×3): qty 2

## 2022-07-18 MED ORDER — DILTIAZEM HCL 25 MG/5ML IV SOLN
25.0000 mg | Freq: Once | INTRAVENOUS | Status: AC
  Administered 2022-07-19: 25 mg via INTRAVENOUS
  Filled 2022-07-18: qty 5

## 2022-07-18 MED ORDER — ROSUVASTATIN CALCIUM 5 MG PO TABS
10.0000 mg | ORAL_TABLET | Freq: Every day | ORAL | Status: DC
  Administered 2022-07-19 – 2022-07-21 (×3): 10 mg via ORAL
  Filled 2022-07-18 (×3): qty 2

## 2022-07-18 MED ORDER — ASPIRIN 81 MG PO TBEC
81.0000 mg | DELAYED_RELEASE_TABLET | Freq: Every day | ORAL | Status: DC
  Administered 2022-07-19 – 2022-07-20 (×2): 81 mg via ORAL
  Filled 2022-07-18 (×2): qty 1

## 2022-07-18 NOTE — Inpatient Diabetes Management (Signed)
Inpatient Diabetes Program Recommendations  AACE/ADA: New Consensus Statement on Inpatient Glycemic Control (2015)  Target Ranges:  Prepandial:   less than 140 mg/dL      Peak postprandial:   less than 180 mg/dL (1-2 hours)      Critically ill patients:  140 - 180 mg/dL   Lab Results  Component Value Date   GLUCAP 242 (H) 07/18/2022   HGBA1C 7.6 (H) 07/17/2022    Latest Reference Range & Units 07/17/22 19:57 07/17/22 21:45 07/18/22 08:22 07/18/22 11:36  Glucose-Capillary 70 - 99 mg/dL 276 (H) 283 (H) 287 (H) 242 (H)  (H): Data is abnormally high Review of Glycemic Control  Diabetes history: type 2 Outpatient Diabetes medications: Basaglar 60 units at HS, Humalog 45 units TID, Jardiance 25 mg daily Current orders for Inpatient glycemic control: Semglee 30 units at HS, Novolog 0-15 units correction scale TID, Novolog 20 units TID  Inpatient Diabetes Program Recommendations:   Noted that blood sugars have been greater than 200 mg/dl.  Recommend increasing Semglee to 35 units at HS. May want to consider adding Novolog 0-5 units HS scale if blood sugars continue to be elevated.   Harvel Ricks RN BSN CDE Diabetes Coordinator Pager: [phone]  8am-5pm

## 2022-07-18 NOTE — Progress Notes (Signed)
PROGRESS NOTE  Hunter Lewis GUY:[id number] DOB: 27-Dec-1955 DOA: 07/17/2022 PCP: [facility]   LOS: 1 day   Brief Narrative / Interim history: Hunter Lewis is a 67 y.o. male with medical history significant of asthma, chronic hypoxic respiratory failure with nightly O2 of 3L, hypertension, diabetes, diastolic CHF, OSA presenting with shortness of breath.  He tells me that this has been going on for the past 3 to 4 days, more so in the last 24 hours.  He reports subjective fevers and chills.  He also has been experiencing burning with urination for the past 2 to 3 days.  Covid, flow, RSV all negative.  Imaging on admission with a CT scan of the chest showed bilateral opacities left greater than right with a masslike/cavitary area at the left lower lobe most suggestive of aspiration sequela resulting in necrotizing pneumonia.  He also has a chronic left pleural effusion.  Subjective / 24h Interval events: Complains of mild shortness of breath this morning, but tells me that overall he is feeling better than last night.  Slept better.  Denies any fever or chills this morning.  Assesement and Plan: Principal Problem:   Necrotizing pneumonia (HCC) Active Problems:   Essential hypertension   Type 2 diabetes mellitus without complication (HCC)   Chronic diastolic congestive heart failure (HCC)   Asthma, chronic, unspecified asthma severity, with acute exacerbation   Acute on chronic respiratory failure with hypoxia (HCC)   UTI (urinary tract infection)   Principal problem Sepsis due to necrotizing pneumonia, underlying unspecified chronic lung disease - based on the CT scan, there was concern for aspiration given debris in the bronchus however clinically he has no history of aspiration and no apparent risk factors.  Met sepsis criteria with elevated WBC, hypotension, tachypnea, source -In reviewing his chart, he used to follow with Dr. Eual Fines with [facility] pulmonary however  apparently his pulmonologist left and currently is seeing no one.  Prior chest imaging has been abnormal for a number of years with concern for sarcoidosis versus amyloidosis or pulmonary alveolar proteinosis. -Pulmonary consulted, appreciate input.  For now continue broad-spectrum antibiotics with vancomycin ceftriaxone, azithromycin.  Procalcitonin is quite elevated  Active problems Acute on chronic hypoxic respiratory failure, asthma exacerbation -he does have a long history of asthma, and apparently this runs in the family.  He never smoked.  He is using 3 L at home at night, but generally he is on room air during daytime.  He is currently on 7 L high flow.  Appreciate pulmonary consultation  Acute kidney injury on CKD 3A - baseline creatinine about 1.3 per chart review, currently up to 2.3.  Not significantly improving with fluids, suspect in the setting of hypotension in the setting of #1.  Hypertension - Holding home amlodipine, Lasix, metoprolol, lisinopril-hydrochlorothiazide in the setting of hypotension   Hyperlipidemia - Continue on fenofibrate  Elevated bilirubin, isolated -unclear significance.  Initial CT scan raised concern for cirrhosis however liver parenchyma appears unremarkable on right upper quadrant ultrasound.  Bilirubin normalized this morning  Diabetes mellitus, poorly controlled, with hyperglycemia-continue long-acting and sliding scale, monitor CBGs  CBG (last 3)  Recent Labs    07/17/22 1957 07/17/22 2145 07/18/22 0822  GLUCAP 276* 283* 287*    Lab Results  Component Value Date   HGBA1C 7.6 (H) 07/17/2022     Scheduled Meds:  enoxaparin (LOVENOX) injection  40 mg Subcutaneous Q24H   insulin aspart  0-15 Units Subcutaneous TID WC  insulin aspart  20 Units Subcutaneous TID WC   insulin glargine-yfgn  30 Units Subcutaneous QHS   ipratropium  0.5 mg Nebulization Q6H WA   sodium chloride flush  3 mL Intravenous Q12H   vancomycin variable dose per  unstable renal function (pharmacist dosing)   Does not apply See admin instructions   Continuous Infusions:  azithromycin (ZITHROMAX) 500 mg in sodium chloride 0.9 % 250 mL IVPB     cefTRIAXone (ROCEPHIN)  IV     lactated ringers     PRN Meds:.acetaminophen **OR** acetaminophen, albuterol, diphenhydrAMINE, polyethylene glycol  Current Outpatient Medications  Medication Instructions   amLODipine (NORVASC) 10 mg, Oral, Daily   aspirin EC 81 mg, Oral, Daily   Basaglar KwikPen 60 Units, Subcutaneous, Daily at bedtime   empagliflozin (JARDIANCE) 25 mg, Oral, Daily   furosemide (LASIX) 40 mg, Oral, Daily   insulin lispro (HUMALOG KWIKPEN) 45 Units, Subcutaneous, 3 times daily with meals   metoprolol succinate (TOPROL-XL) 100 mg, Oral, Daily, Take with or immediately following a meal.   rosuvastatin (CRESTOR) 10 mg, Oral, Daily   tamsulosin (FLOMAX) 0.4 mg, Oral, Daily    Diet Orders (From admission, onward)     Start     Ordered   07/17/22 1513  Diet heart healthy/carb modified Room service appropriate? Yes; Fluid consistency: Thin  Diet effective now       Question Answer Comment  Diet-HS Snack? Nothing   Room service appropriate? Yes   Fluid consistency: Thin      07/17/22 1517            DVT prophylaxis: enoxaparin (LOVENOX) injection 40 mg Start: 07/17/22 1600   Lab Results  Component Value Date   PLT 178 07/18/2022      Code Status: Full Code  Family Communication: no family at bedside  Status is: Inpatient  Remains inpatient appropriate because: severity of illness  Level of care: Progressive  Consultants:  Pulmonary   Objective: Vitals:   07/17/22 2325 07/18/22 0303 07/18/22 0500 07/18/22 0747  BP: (!) 85/46 (!) 88/54  (!) 100/59  Pulse: 86 79  80  Resp: (!) 28 17  20   Temp: 97.8 F (36.6 C) 97.7 F (36.5 C)  97.7 F (36.5 C)  TempSrc: Oral Oral  Oral  SpO2: 90% 91%  94%  Weight:   109 kg   Height:        Intake/Output Summary (Last 24  hours) at 07/18/2022 0818 Last data filed at 07/18/2022 4268 Gross per 24 hour  Intake 905.61 ml  Output --  Net 905.61 ml   Wt Readings from Last 3 Encounters:  07/18/22 109 kg  03/16/17 112.9 kg  03/13/17 112.9 kg    Examination:  Constitutional: NAD Eyes: no scleral icterus ENMT: Mucous membranes are moist.  Neck: normal, supple Respiratory: Diffuse bilateral rhonchi, coarse breath sounds throughout.  Faint end expiratory wheezing Cardiovascular: Regular rate and rhythm, no murmurs / rubs / gallops.  Abdomen: non distended, no tenderness. Bowel sounds positive.  Musculoskeletal: no clubbing / cyanosis.  Skin: no rashes Neurologic: non focal   Data Reviewed: I have independently reviewed following labs and imaging studies   CBC Recent Labs  Lab 07/17/22 1140 07/18/22 0339  WBC 16.4* 13.8*  HGB 13.3 13.5  HCT 42.3 42.0  PLT 180 178  MCV 84.8 85.0  MCH 26.7 27.3  MCHC 31.4 32.1  RDW 17.9* 17.7*  LYMPHSABS 0.2*  --   MONOABS 1.3*  --   EOSABS 0.0  --  BASOSABS 0.0  --     Recent Labs  Lab 07/17/22 1113 07/17/22 1140 07/17/22 1614 07/17/22 1818 07/18/22 0339  NA  --  131*  --   --  132*  K  --  4.2  --   --  4.0  CL  --  94*  --   --  95*  CO2  --  24  --   --  25  GLUCOSE  --  191*  --   --  284*  BUN  --  59*  --   --  78*  CREATININE  --  2.31*  --   --  2.35*  CALCIUM  --  7.8*  --   --  7.7*  AST  --  24 29  --  71*  ALT  --  25 29  --  63*  ALKPHOS  --  82 63  --  66  BILITOT  --  1.9* 1.3*  --  1.0  ALBUMIN  --  2.7* 2.4*  --  2.3*  PROCALCITON  --   --  7.09  --  7.64  LATICACIDVEN  --  1.7  --   --   --   INR  --  1.6*  --   --  1.6*  HGBA1C  --   --   --  7.6*  --   BNP 119.8*  --   --   --   --     ------------------------------------------------------------------------------------------------------------------ No results for input(s): "CHOL", "HDL", "LDLCALC", "TRIG", "CHOLHDL", "LDLDIRECT" in the last 72 hours.  Lab Results   Component Value Date   HGBA1C 7.6 (H) 07/17/2022   ------------------------------------------------------------------------------------------------------------------ No results for input(s): "TSH", "T4TOTAL", "T3FREE", "THYROIDAB" in the last 72 hours.  Invalid input(s): "FREET3"  Cardiac Enzymes No results for input(s): "CKMB", "TROPONINI", "MYOGLOBIN" in the last 168 hours.  Invalid input(s): "CK" ------------------------------------------------------------------------------------------------------------------    Component Value Date/Time   BNP 119.8 (H) 07/17/2022 1113    CBG: Recent Labs  Lab 07/17/22 1957 07/17/22 2145  GLUCAP 276* 283*    Recent Results (from the past 240 hour(s))  Resp panel by RT-PCR (RSV, Flu A&B, Covid) Anterior Nasal Swab     Status: None   Collection Time: 07/17/22 11:13 AM   Specimen: Anterior Nasal Swab  Result Value Ref Range Status   SARS Coronavirus 2 by RT PCR NEGATIVE NEGATIVE Final   Influenza A by PCR NEGATIVE NEGATIVE Final   Influenza B by PCR NEGATIVE NEGATIVE Final    Comment: (NOTE) The Xpert Xpress SARS-CoV-2/FLU/RSV plus assay is intended as an aid in the diagnosis of influenza from Nasopharyngeal swab specimens and should not be used as a sole basis for treatment. Nasal washings and aspirates are unacceptable for Xpert Xpress SARS-CoV-2/FLU/RSV testing.  Fact Sheet for Patients: BloggerCourse.com  Fact Sheet for Healthcare Providers: SeriousBroker.it  This test is not yet approved or cleared by the [place] FDA and has been authorized for detection and/or diagnosis of SARS-CoV-2 by FDA under an Emergency Use Authorization (EUA). This EUA will remain in effect (meaning this test can be used) for the duration of the COVID-19 declaration under Section 564(b)(1) of the Act, 21 U.S.C. section 360bbb-3(b)(1), unless the authorization is terminated or revoked.      Resp Syncytial Virus by PCR NEGATIVE NEGATIVE Final    Comment: (NOTE) Fact Sheet for Patients: BloggerCourse.com  Fact Sheet for Healthcare Providers: SeriousBroker.it  This test is not yet approved or cleared by the [place] FDA and has been authorized for detection and/or diagnosis of SARS-CoV-2 by FDA under an Emergency Use Authorization (EUA). This EUA will remain in effect (meaning this test can be used) for the duration of the COVID-19 declaration under Section 564(b)(1) of the Act, 21 U.S.C. section 360bbb-3(b)(1), unless the authorization is terminated or revoked.  Performed at [facility], [place] [address], [place], [place] [address]   Urine Culture     Status: None (Preliminary result)   Collection Time: 07/17/22 11:40 AM   Specimen: Urine, Clean Catch  Result Value Ref Range Status   Specimen Description URINE, CLEAN CATCH  Final   Special Requests   Final    NONE Reflexed from [id number] Performed at [facility], [place] [address], [place], [place] [address]    Culture PENDING  Incomplete   Report Status PENDING  Incomplete     Radiology Studies: US Abdomen Complete  Result Date: 07/17/2022 CLINICAL DATA:  13086 Splenomegaly [id number] Cirrhosis ([place]) [address] EXAM: ABDOMEN ULTRASOUND COMPLETE COMPARISON:  Right upper quadrant ultrasound 01/30/2017 FINDINGS: Gallbladder: Prior cholecystectomy. Common bile duct: Diameter: 6.4 mm, normal. No intrahepatic ductal dilation. Liver: Normal liver parenchymal echogenicity. Normal contours. There is a focal echogenic nodule in the left hepatic lobe measuring 1.6 x 1.3 x 1.8 cm. Portal vein is patent on color Doppler imaging with normal direction of blood flow towards the liver. IVC: No abnormality visualized. Pancreas: Visualized portion unremarkable. Spleen: Splenomegaly. Right Kidney: Length: 12.5 cm. Echogenicity within normal limits. No mass or hydronephrosis  visualized. Left Kidney: Length: 13.5 cm. Echogenicity within normal limits. No mass or hydronephrosis visualized. Abdominal aorta: No aneurysm visualized. Other findings: None. IMPRESSION: Normal sonographic appearance of the liver. No evidence of hepatic cirrhosis. Normal direction of portal venous flow. No ascites. Echogenic nodular lesion in the left hepatic lobe measuring up to 1.8 cm, most likely a hemangioma. Splenomegaly. Electronically Signed   By: Maurine Simmering M.D.   On: 07/17/2022 18:14   CT CHEST WO CONTRAST  Result Date: 07/17/2022 CLINICAL DATA:  Respiratory illness, fever, shortness of breath, hypoxia EXAM: CT CHEST WITHOUT CONTRAST TECHNIQUE: Multidetector CT imaging of the chest was performed following the standard protocol without IV contrast. RADIATION DOSE REDUCTION: This exam was performed according to the departmental dose-optimization program which includes automated exposure control, adjustment of the mA and/or kV according to patient size and/or use of iterative reconstruction technique. COMPARISON:  Chest radiographs, 07/17/2022, CT chest, 11/26/2019 FINDINGS: Cardiovascular: No significant vascular findings. Normal heart size. No pericardial effusion. Mediastinum/Nodes: Numerous generally unchanged prominent and enlarged lymph nodes throughout the mediastinum and hila, index prevascular node measuring 1.7 x 1.3 cm (series 3, image 56). Thyroid gland, trachea, and esophagus demonstrate no significant findings. Lungs/Pleura: Frothy debris throughout the left mainstem bronchus and left lower lobe airways. Very extensive heterogeneous and consolidative airspace opacity throughout the lungs, more conspicuous and dense in the left lung although also seen in the right lung base. Masslike, cavitating appearance of the left lower lobe, this region measuring approximately 9.3 x 6.9 cm (series 3, image 101). Chronic, loculated small left pleural effusion, similar to prior examination, with  associated pleural thickening. Chronic scarring at the lung bases, diffuse bilateral bronchial wall thickening, and interlobular septal thickening, similar to prior examination. Upper Abdomen: No acute abnormality. Coarse contour of the liver.  Partially imaged splenomegaly. Musculoskeletal: No chest wall abnormality. No acute osseous findings. IMPRESSION: 1. Very extensive heterogeneous and consolidative airspace opacity throughout the lungs, more  conspicuous and dense in the left lung although also seen in the right lung base. 2. Masslike, cavitating appearance of the left lower lobe, this region measuring approximately 9.3 x 6.9 cm. 3. Findings are most suggestive of sequelae of aspiration resulting in necrotizing pneumonia. 4. Chronic, loculated small left pleural effusion, similar to prior examination, with associated pleural thickening. 5. Chronic scarring at the lung bases, diffuse bilateral bronchial wall thickening, and interlobular septal thickening, similar to prior examination. Findings most likely reflect a combination of chronic sequelae of prior infection or aspiration with superimposed pulmonary edema. 6. Numerous generally unchanged prominent and enlarged lymph nodes throughout the mediastinum and hila, likely reactive. 7. Coarse contour of the liver, suggestive of cirrhosis. Partially imaged splenomegaly. Electronically Signed   By: Delanna Ahmadi M.D.   On: 07/17/2022 14:33   DG Chest Port 1 View  Result Date: 07/17/2022 CLINICAL DATA:  Questionable sepsis - evaluate for abnormality EXAM: PORTABLE CHEST 1 VIEW COMPARISON:  CT 07/01/2021, CT 11/26/2019, radiograph 08/03/2019 FINDINGS: Unchanged cardiomediastinal silhouette. There is left mid to lower lung airspace disease and a small left pleural effusion. There is also right lower lung airspace disease and diffuse interstitial prominence. No evidence of pneumothorax. No acute osseous abnormality. IMPRESSION: Diffuse interstitial opacities with  airspace disease throughout the left lung and in the right lower lung, concerning for pulmonary edema and/or multifocal pneumonia. Chronic small left pleural effusion with pleural thickening as seen on recent and prior CTs. Electronically Signed   By: Maurine Simmering M.D.   On: 07/17/2022 11:31     Marzetta Board, MD, PhD [facility]  Between 7 am - 7 pm I am available, please contact me via Amion (for emergencies) or Securechat (non urgent messages)  Between 7 pm - 7 am I am not available, please contact night coverage MD/APP via Amion
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
## 2022-07-19 ENCOUNTER — Inpatient Hospital Stay (HOSPITAL_COMMUNITY): Payer: No Typology Code available for payment source

## 2022-07-19 DIAGNOSIS — J850 Gangrene and necrosis of lung: Secondary | ICD-10-CM | POA: Diagnosis not present

## 2022-07-19 DIAGNOSIS — I4891 Unspecified atrial fibrillation: Secondary | ICD-10-CM

## 2022-07-19 LAB — ECHOCARDIOGRAM COMPLETE
AR max vel: 2.63 cm2
AV Area VTI: 2.95 cm2
AV Area mean vel: 2.62 cm2
AV Mean grad: 3.3 mmHg
AV Peak grad: 6.1 mmHg
Ao pk vel: 1.23 m/s
Area-P 1/2: 2.26 cm2
Calc EF: 43.1 %
Height: 72 in
S' Lateral: 3.6 cm
Single Plane A2C EF: 40.1 %
Single Plane A4C EF: 44.5 %
Weight: 3858.93 [oz_av]

## 2022-07-19 LAB — COMPREHENSIVE METABOLIC PANEL WITH GFR
ALT: 74 U/L — ABNORMAL HIGH (ref 0–44)
AST: 31 U/L (ref 15–41)
Albumin: 2.3 g/dL — ABNORMAL LOW (ref 3.5–5.0)
Alkaline Phosphatase: 88 U/L (ref 38–126)
Anion gap: 15 (ref 5–15)
BUN: 96 mg/dL — ABNORMAL HIGH (ref 8–23)
CO2: 21 mmol/L — ABNORMAL LOW (ref 22–32)
Calcium: 8 mg/dL — ABNORMAL LOW (ref 8.9–10.3)
Chloride: 94 mmol/L — ABNORMAL LOW (ref 98–111)
Creatinine, Ser: 1.96 mg/dL — ABNORMAL HIGH (ref 0.61–1.24)
GFR, Estimated: 37 mL/min — ABNORMAL LOW (ref >60)
Glucose, Bld: 208 mg/dL — ABNORMAL HIGH (ref 70–99)
Potassium: 4.3 mmol/L (ref 3.5–5.1)
Sodium: 130 mmol/L — ABNORMAL LOW (ref 135–145)
Total Bilirubin: 0.8 mg/dL (ref 0.3–1.2)
Total Protein: 5.6 g/dL — ABNORMAL LOW (ref 6.5–8.1)

## 2022-07-19 LAB — RESPIRATORY PANEL BY PCR

## 2022-07-19 LAB — HEPARIN LEVEL (UNFRACTIONATED): Heparin Unfractionated: <0.1 IU/mL — ABNORMAL LOW (ref 0.30–0.70)

## 2022-07-19 LAB — GLUCOSE, CAPILLARY
Glucose-Capillary: 210 mg/dL — ABNORMAL HIGH (ref 70–99)
Glucose-Capillary: 239 mg/dL — ABNORMAL HIGH (ref 70–99)
Glucose-Capillary: 159 mg/dL — ABNORMAL HIGH (ref 70–99)
Glucose-Capillary: 102 mg/dL — ABNORMAL HIGH (ref 70–99)

## 2022-07-19 LAB — TROPONIN I (HIGH SENSITIVITY): Troponin I (High Sensitivity): 7 ng/L (ref <18)

## 2022-07-19 LAB — VANCOMYCIN, RANDOM: Vancomycin Rm: 12 ug/mL

## 2022-07-19 LAB — CBC
HCT: 46.7 % (ref 39.0–52.0)
Hemoglobin: 15 g/dL (ref 13.0–17.0)
MCH: 27 pg (ref 26.0–34.0)
MCHC: 32.1 g/dL (ref 30.0–36.0)
MCV: 84.1 fL (ref 80.0–100.0)
Platelets: 244 K/uL (ref 150–400)
RBC: 5.55 MIL/uL (ref 4.22–5.81)
RDW: 17.4 % — ABNORMAL HIGH (ref 11.5–15.5)
WBC: 21.8 K/uL — ABNORMAL HIGH (ref 4.0–10.5)
nRBC: 0 % (ref 0.0–0.2)

## 2022-07-19 LAB — PROCALCITONIN: Procalcitonin: 4.95 ng/mL

## 2022-07-19 LAB — STREP PNEUMONIAE URINARY ANTIGEN: Strep Pneumo Urinary Antigen: NEGATIVE

## 2022-07-19 LAB — BRAIN NATRIURETIC PEPTIDE: B Natriuretic Peptide: 339.7 pg/mL — ABNORMAL HIGH (ref 0.0–100.0)

## 2022-07-19 LAB — MAGNESIUM: Magnesium: 2.9 mg/dL — ABNORMAL HIGH (ref 1.7–2.4)

## 2022-07-19 MED ORDER — PANTOPRAZOLE SODIUM 40 MG PO TBEC
40.0000 mg | DELAYED_RELEASE_TABLET | Freq: Every day | ORAL | Status: DC
  Administered 2022-07-19 – 2022-07-21 (×3): 40 mg via ORAL
  Filled 2022-07-19 (×3): qty 1

## 2022-07-19 MED ORDER — HEPARIN (PORCINE) 25000 UT/250ML-% IV SOLN
2350.0000 [IU]/h | INTRAVENOUS | Status: DC
  Administered 2022-07-19: 1400 [IU]/h via INTRAVENOUS
  Administered 2022-07-20: 2150 [IU]/h via INTRAVENOUS
  Filled 2022-07-19 (×4): qty 250

## 2022-07-19 MED ORDER — PERFLUTREN LIPID MICROSPHERE
1.0000 mL | INTRAVENOUS | Status: AC | PRN
  Administered 2022-07-19: 2 mL via INTRAVENOUS
  Filled 2022-07-19: qty 10

## 2022-07-19 MED ORDER — METOPROLOL TARTRATE 5 MG/5ML IV SOLN
5.0000 mg | INTRAVENOUS | Status: AC | PRN
  Administered 2022-07-19 (×2): 5 mg via INTRAVENOUS
  Filled 2022-07-19 (×2): qty 5

## 2022-07-19 MED ORDER — SALINE SPRAY 0.65 % NA SOLN
1.0000 | NASAL | Status: DC | PRN
  Administered 2022-07-19: 1 via NASAL
  Filled 2022-07-19 (×2): qty 44

## 2022-07-19 MED ORDER — PIPERACILLIN-TAZOBACTAM 3.375 G IVPB
3.3750 g | Freq: Three times a day (TID) | INTRAVENOUS | Status: DC
  Administered 2022-07-19 – 2022-07-21 (×6): 3.375 g via INTRAVENOUS
  Filled 2022-07-19 (×7): qty 50

## 2022-07-19 NOTE — Progress Notes (Signed)
ANTICOAGULATION CONSULT NOTE - Initial Consult  Pharmacy Consult for heparin Indication: atrial fibrillation  No Known Allergies  Patient Measurements: Height: 6' (182.9 cm) Weight: 109.4 kg (241 lb 2.9 oz) IBW/kg (Calculated) : 77.6 Heparin Dosing Weight: 100.8 kg  Vital Signs: Temp: 97.7 F (36.5 C) (02/03 0749) Temp Source: Oral (02/03 0749) BP: 121/94 (02/03 0815) Pulse Rate: 109 (02/03 0815)  Labs: Recent Labs    07/17/22 1140 07/17/22 1340 07/18/22 0339 07/19/22 0304  HGB 13.3  --  13.5 15.0  HCT 42.3  --  42.0 46.7  PLT 180  --  178 244  APTT 29  --   --   --   LABPROT 18.8*  --  18.6*  --   INR 1.6*  --  1.6*  --   CREATININE 2.31*  --  2.35* 1.96*  TROPONINIHS 5 7  --   --     Estimated Creatinine Clearance: 48.6 mL/min (A) (by C-G formula based on SCr of 1.96 mg/dL (H)).   Medical History: Past Medical History:  Diagnosis Date   Arthritis    hands    Asthma    Chronic diastolic heart failure (HCC)    Complication of anesthesia    slow to awaken   Diabetes mellitus without complication ([place])    Type II   Dyspnea    with asthma- last issue  was early September   Hypertension    Neuropathy    feet   Pneumonia 2017   2017- hospitalized [place], 2018  did not require hospitalization    Medications:  Scheduled:   aspirin EC  81 mg Oral Daily   insulin aspart  0-15 Units Subcutaneous TID WC   insulin aspart  20 Units Subcutaneous TID WC   insulin glargine-yfgn  35 Units Subcutaneous QHS   ipratropium  0.5 mg Nebulization Q6H WA   metoprolol succinate  100 mg Oral Daily   rosuvastatin  10 mg Oral Daily   sodium chloride flush  3 mL Intravenous Q12H   vancomycin variable dose per unstable renal function (pharmacist dosing)   Does not apply See admin instructions    Assessment: 67 yo male with new onset afib w/ RVR likely in setting of necrotizing PNA. CHADs-VASc = 2. Pharmacy consulted to begin IV heparin with no bolus given history of  hemoptysis. No AC pta - received enox 40mg  here for vte ppx (LD 2/2 @ 1654) INR 1.6, CBC stable/wnl  Goal of Therapy:  Heparin level 0.3-0.7 units/ml Monitor platelets by anticoagulation protocol: Yes   Plan:  No bolus given concern for hemoptysis Start heparin drip at 1400 units/hr  Check heparin level in 6 hours Daily heparin level, CBC  Dimple Nanas, PharmD, BCPS 07/19/2022 10:50 AM
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
## 2022-07-19 NOTE — Progress Notes (Addendum)
ANTICOAGULATION CONSULT NOTE  Pharmacy Consult for heparin Indication: atrial fibrillation  No Known Allergies  Patient Measurements: Height: 6' (182.9 cm) Weight: 109.4 kg (241 lb 2.9 oz) IBW/kg (Calculated) : 77.6 Heparin Dosing Weight: 100.8 kg  Vital Signs: Temp: 97.9 F (36.6 C) (02/03 1625) Temp Source: Oral (02/03 1625) BP: 105/68 (02/03 1625) Pulse Rate: 109 (02/03 1625)  Labs: Recent Labs    07/17/22 1140 07/17/22 1340 07/18/22 0339 07/19/22 0304 07/19/22 1148 07/19/22 1656  HGB 13.3  --  13.5 15.0  --   --   HCT 42.3  --  42.0 46.7  --   --   PLT 180  --  178 244  --   --   APTT 29  --   --   --   --   --   LABPROT 18.8*  --  18.6*  --   --   --   INR 1.6*  --  1.6*  --   --   --   HEPARINUNFRC  --   --   --   --   --  <0.10*  CREATININE 2.31*  --  2.35* 1.96*  --   --   TROPONINIHS 5 7  --   --  7  --      Estimated Creatinine Clearance: 48.6 mL/min (A) (by C-G formula based on SCr of 1.96 mg/dL (H)).   Medical History: Past Medical History:  Diagnosis Date   Arthritis    hands    Asthma    Chronic diastolic heart failure (HCC)    Complication of anesthesia    slow to awaken   Diabetes mellitus without complication ([place])    Type II   Dyspnea    with asthma- last issue  was early September   Hypertension    Neuropathy    feet   Pneumonia 2017   2017- hospitalized [place], 2018  did not require hospitalization    Medications:  Scheduled:   aspirin EC  81 mg Oral Daily   insulin aspart  0-15 Units Subcutaneous TID WC   insulin aspart  20 Units Subcutaneous TID WC   insulin glargine-yfgn  35 Units Subcutaneous QHS   ipratropium  0.5 mg Nebulization Q6H WA   metoprolol succinate  100 mg Oral Daily   pantoprazole  40 mg Oral Daily   rosuvastatin  10 mg Oral Daily   sodium chloride flush  3 mL Intravenous Q12H    Assessment: 67 yo male with new-onset afib w/ RVR likely in setting of necrotizing PNA. CHADs-VASc = 2. Pharmacy  consulted to begin IV heparin with no bolus given history of hemoptysis. No anticoagulation pta. INR 1.6 stable (last 2/2), CBC WNL. AKI improving.  Initial heparin level resulted as undetectable, drawn ~1 hour early. No new bleeding or issues with infusion per discussion with RN.  Goal of Therapy:  Heparin level 0.3-0.7 units/ml Monitor platelets by anticoagulation protocol: Yes   Plan:  No bolus given concern for hemoptysis Increase heparin drip to 1700 units/hr  Check heparin level in 6 hours Monitor daily CBC, s/sx bleeding   Arturo Morton, PharmD, BCPS Please check AMION for all [place] contact numbers Clinical Pharmacist 07/19/2022 5:34 PM

## 2022-07-19 NOTE — Plan of Care (Signed)

## 2022-07-19 NOTE — Progress Notes (Signed)
PROGRESS NOTE  Hunter Lewis UVO:[id number] DOB: 10-21-1955 DOA: 07/17/2022 PCP: [facility]   LOS: 2 days   Brief Narrative / Interim history: Hunter Lewis is a 67 y.o. male with medical history significant of asthma, chronic hypoxic respiratory failure with nightly O2 of 3L, hypertension, diabetes, diastolic CHF, OSA presenting with shortness of breath.  He tells me that this has been going on for the past 3 to 4 days, more so in the last 24 hours.  He reports subjective fevers and chills.  He also has been experiencing burning with urination for the past 2 to 3 days.  Covid, flow, RSV all negative.  Imaging on admission with a CT scan of the chest showed bilateral opacities left greater than right with a masslike/cavitary area at the left lower lobe most suggestive of aspiration sequela resulting in necrotizing pneumonia.  He also has a chronic left pleural effusion.  Subjective / 24h Interval events: Felt palpitations overnight which is new for him.  Was found to be in A-fib and he denies any personal history of A-fib.  States that her breathing is about the same  Assesement and Plan: Principal Problem:   Necrotizing pneumonia ([place]) Active Problems:   Essential hypertension   Type 2 diabetes mellitus without complication (HCC)   Chronic diastolic congestive heart failure (HCC)   Asthma, chronic, unspecified asthma severity, with acute exacerbation   Acute on chronic respiratory failure with hypoxia (HCC)   UTI (urinary tract infection)   Principal problem Sepsis due to necrotizing pneumonia, underlying unspecified chronic lung disease - based on the CT scan, there was concern for aspiration given debris in the bronchus however clinically he has no history of aspiration and no apparent risk factors.  Met sepsis criteria with elevated WBC, hypotension, tachypnea, source. -In reviewing his chart, he used to follow with Dr. Dan Europe with [facility] pulmonary however  apparently his pulmonologist left and currently is seeing no one.  Pulmonary consulted, appreciate input.  Discussed with Dr. Valeta Harms this morning, continue antibiotics and will need 3 weeks of Augmentin once he improves and goes home, but may need bronc earlier if he fails to respond to treatment  Active problems Acute on chronic hypoxic respiratory failure, asthma exacerbation -he does have a long history of asthma, and apparently this runs in the family.  He never smoked.  He is using 3 L at home at night, but generally he is on room air during daytime.  Remains significantly hypoxic on 9 L high flow this morning.  A-fib with RVR -new onset, likely in the setting of #1.  Has persistent A-fib this morning, his CHA2DS2-VASc 2 score is 2 for hypertension and diabetes, but he is about to turn 67 later this year.  Start anticoagulation and closely monitor -Obtain a 2D echo  Acute kidney injury on CKD 3A - baseline creatinine about 1.3 per chart review, up to 2.3 on admission.  Creatinine improving at 1.9 this morning  Hypertension - Holding home amlodipine, Lasix, lisinopril-hydrochlorothiazide in the setting of hypotension initially.  Resume metoprolol today due to A-fib with RVR   Hyperlipidemia - Continue on fenofibrate  Elevated bilirubin, isolated -unclear significance.  Initial CT scan raised concern for cirrhosis however liver parenchyma appears unremarkable on right upper quadrant ultrasound.  Bilirubin normalized this morning  Diabetes mellitus, poorly controlled, with hyperglycemia-continue long-acting and sliding scale, CBGs acceptable as below  CBG (last 3)  Recent Labs    07/18/22 1604 07/18/22 2115 07/19/22 7425  GLUCAP 200* 164* 210*     Lab Results  Component Value Date   HGBA1C 7.6 (H) 07/17/2022     Scheduled Meds:  aspirin EC  81 mg Oral Daily   enoxaparin (LOVENOX) injection  40 mg Subcutaneous Q24H   insulin aspart  0-15 Units Subcutaneous TID WC   insulin  aspart  20 Units Subcutaneous TID WC   insulin glargine-yfgn  35 Units Subcutaneous QHS   ipratropium  0.5 mg Nebulization Q6H WA   metoprolol succinate  100 mg Oral Daily   rosuvastatin  10 mg Oral Daily   sodium chloride flush  3 mL Intravenous Q12H   vancomycin variable dose per unstable renal function (pharmacist dosing)   Does not apply See admin instructions   Continuous Infusions:  azithromycin (ZITHROMAX) 500 mg in sodium chloride 0.9 % 250 mL IVPB 500 mg (07/19/22 1020)   cefTRIAXone (ROCEPHIN)  IV 2 g (07/19/22 0914)   lactated ringers     PRN Meds:.acetaminophen **OR** acetaminophen, albuterol, diphenhydrAMINE, perflutren lipid microspheres (DEFINITY) IV suspension, polyethylene glycol  Current Outpatient Medications  Medication Instructions   amLODipine (NORVASC) 10 mg, Oral, Daily   aspirin EC 81 mg, Oral, Daily   Basaglar KwikPen 60 Units, Subcutaneous, Daily at bedtime   empagliflozin (JARDIANCE) 25 mg, Oral, Daily   furosemide (LASIX) 40 mg, Oral, Daily   insulin lispro (HUMALOG KWIKPEN) 45 Units, Subcutaneous, 3 times daily with meals   metoprolol succinate (TOPROL-XL) 100 mg, Oral, Daily, Take with or immediately following a meal.   rosuvastatin (CRESTOR) 10 mg, Oral, Daily   tamsulosin (FLOMAX) 0.4 mg, Oral, Daily    Diet Orders (From admission, onward)     Start     Ordered   07/17/22 1513  Diet heart healthy/carb modified Room service appropriate? Yes; Fluid consistency: Thin  Diet effective now       Question Answer Comment  Diet-HS Snack? Nothing   Room service appropriate? Yes   Fluid consistency: Thin      07/17/22 1517            DVT prophylaxis: enoxaparin (LOVENOX) injection 40 mg Start: 07/17/22 1600   Lab Results  Component Value Date   PLT 244 07/19/2022      Code Status: Full Code  Family Communication: no family at bedside, updated wife over the phone  Status is: Inpatient  Remains inpatient appropriate because: severity of  illness  Level of care: Progressive  Consultants:  Pulmonary   Objective: Vitals:   07/19/22 0350 07/19/22 0500 07/19/22 0749 07/19/22 0815  BP: 117/87  113/70 (!) 121/94  Pulse: 100  (!) 120 (!) 109  Resp: 19  20 20   Temp: 97.7 F (36.5 C)  97.7 F (36.5 C)   TempSrc: Oral  Oral   SpO2: 90%  90% 90%  Weight:  109.4 kg    Height:        Intake/Output Summary (Last 24 hours) at 07/19/2022 1039 Last data filed at 07/19/2022 0650 Gross per 24 hour  Intake 592.32 ml  Output 1990 ml  Net -1397.68 ml    Wt Readings from Last 3 Encounters:  07/19/22 109.4 kg  03/16/17 112.9 kg  03/13/17 112.9 kg    Examination:  Constitutional: NAD Eyes: lids and conjunctivae normal, no scleral icterus ENMT: mmm Neck: normal, supple Respiratory: Diffuse bilateral rhonchi, no wheezing Cardiovascular: Irregularly irregular, no edema Abdomen: soft, no distention, no tenderness. Bowel sounds positive.  Skin: no rashes Neurologic: no focal deficits, equal strength  Data Reviewed: I have independently reviewed following labs and imaging studies   CBC Recent Labs  Lab 07/17/22 1140 07/18/22 0339 07/19/22 0304  WBC 16.4* 13.8* 21.8*  HGB 13.3 13.5 15.0  HCT 42.3 42.0 46.7  PLT 180 178 244  MCV 84.8 85.0 84.1  MCH 26.7 27.3 27.0  MCHC 31.4 32.1 32.1  RDW 17.9* 17.7* 17.4*  LYMPHSABS 0.2*  --   --   MONOABS 1.3*  --   --   EOSABS 0.0  --   --   BASOSABS 0.0  --   --      Recent Labs  Lab 07/17/22 1113 07/17/22 1140 07/17/22 1614 07/17/22 1818 07/18/22 0339 07/19/22 0304  NA  --  131*  --   --  132* 130*  K  --  4.2  --   --  4.0 4.3  CL  --  94*  --   --  95* 94*  CO2  --  24  --   --  25 21*  GLUCOSE  --  191*  --   --  284* 208*  BUN  --  59*  --   --  78* 96*  CREATININE  --  2.31*  --   --  2.35* 1.96*  CALCIUM  --  7.8*  --   --  7.7* 8.0*  AST  --  24 29  --  71* 31  ALT  --  25 29  --  63* 74*  ALKPHOS  --  82 63  --  66 88  BILITOT  --  1.9* 1.3*  --  1.0  0.8  ALBUMIN  --  2.7* 2.4*  --  2.3* 2.3*  MG  --   --   --   --   --  2.9*  PROCALCITON  --   --  7.09  --  7.64 4.95  LATICACIDVEN  --  1.7  --   --   --   --   INR  --  1.6*  --   --  1.6*  --   HGBA1C  --   --   --  7.6*  --   --   BNP 119.8*  --   --   --   --   --      ------------------------------------------------------------------------------------------------------------------ No results for input(s): "CHOL", "HDL", "LDLCALC", "TRIG", "CHOLHDL", "LDLDIRECT" in the last 72 hours.  Lab Results  Component Value Date   HGBA1C 7.6 (H) 07/17/2022   ------------------------------------------------------------------------------------------------------------------ No results for input(s): "TSH", "T4TOTAL", "T3FREE", "THYROIDAB" in the last 72 hours.  Invalid input(s): "FREET3"  Cardiac Enzymes No results for input(s): "CKMB", "TROPONINI", "MYOGLOBIN" in the last 168 hours.  Invalid input(s): "CK" ------------------------------------------------------------------------------------------------------------------    Component Value Date/Time   BNP 119.8 (H) 07/17/2022 1113    CBG: Recent Labs  Lab 07/18/22 0822 07/18/22 1136 07/18/22 1604 07/18/22 2115 07/19/22 0824  GLUCAP 287* 242* 200* 164* 210*     Recent Results (from the past 240 hour(s))  Resp panel by RT-PCR (RSV, Flu A&B, Covid) Anterior Nasal Swab     Status: None   Collection Time: 07/17/22 11:13 AM   Specimen: Anterior Nasal Swab  Result Value Ref Range Status   SARS Coronavirus 2 by RT PCR NEGATIVE NEGATIVE Final   Influenza A by PCR NEGATIVE NEGATIVE Final   Influenza B by PCR NEGATIVE NEGATIVE Final    Comment: (NOTE) The Xpert Xpress SARS-CoV-2/FLU/RSV plus assay is intended as an aid in  the diagnosis of influenza from Nasopharyngeal swab specimens and should not be used as a sole basis for treatment. Nasal washings and aspirates are unacceptable for Xpert Xpress  SARS-CoV-2/FLU/RSV testing.  Fact Sheet for Patients: BloggerCourse.com  Fact Sheet for Healthcare Providers: SeriousBroker.it  This test is not yet approved or cleared by the [place] FDA and has been authorized for detection and/or diagnosis of SARS-CoV-2 by FDA under an Emergency Use Authorization (EUA). This EUA will remain in effect (meaning this test can be used) for the duration of the COVID-19 declaration under Section 564(b)(1) of the Act, 21 U.S.C. section 360bbb-3(b)(1), unless the authorization is terminated or revoked.     Resp Syncytial Virus by PCR NEGATIVE NEGATIVE Final    Comment: (NOTE) Fact Sheet for Patients: BloggerCourse.com  Fact Sheet for Healthcare Providers: SeriousBroker.it  This test is not yet approved or cleared by the [place] FDA and has been authorized for detection and/or diagnosis of SARS-CoV-2 by FDA under an Emergency Use Authorization (EUA). This EUA will remain in effect (meaning this test can be used) for the duration of the COVID-19 declaration under Section 564(b)(1) of the Act, 21 U.S.C. section 360bbb-3(b)(1), unless the authorization is terminated or revoked.  Performed at [facility], [address] [place], [place] [address]   Blood Culture (routine x 2)     Status: None (Preliminary result)   Collection Time: 07/17/22 11:40 AM   Specimen: BLOOD  Result Value Ref Range Status   Specimen Description BLOOD RIGHT ANTECUBITAL  Final   Special Requests   Final    BOTTLES DRAWN AEROBIC AND ANAEROBIC Blood Culture adequate volume   Culture   Final    NO GROWTH 2 DAYS Performed at [facility], [address] [place], [place] [address]    Report Status PENDING  Incomplete  Urine Culture     Status: Abnormal   Collection Time: 07/17/22 11:40 AM   Specimen: Urine, Clean Catch  Result Value Ref Range Status    Specimen Description URINE, CLEAN CATCH  Final   Special Requests NONE Reflexed from [id number]  Final   Culture (A)  Final    60,000 COLONIES/mL YEAST Performed at [facility], [address] [place], [place] [address]    Report Status 07/18/2022 FINAL  Final  Blood Culture (routine x 2)     Status: None (Preliminary result)   Collection Time: 07/17/22 11:58 AM   Specimen: BLOOD LEFT HAND  Result Value Ref Range Status   Specimen Description BLOOD LEFT HAND  Final   Special Requests   Final    BOTTLES DRAWN AEROBIC AND ANAEROBIC Blood Culture adequate volume   Culture   Final    NO GROWTH 2 DAYS Performed at [facility], [address], [place], [place] [address]    Report Status PENDING  Incomplete  MRSA Next Gen by PCR, Nasal     Status: None   Collection Time: 07/17/22  3:28 PM   Specimen: Nasal Mucosa; Nasal Swab  Result Value Ref Range Status   MRSA by PCR Next Gen NOT DETECTED NOT DETECTED Final    Comment: (NOTE) The GeneXpert MRSA Assay (FDA approved for NASAL specimens only), is one component of a comprehensive MRSA colonization surveillance program. It is not intended to diagnose MRSA infection nor to guide or monitor treatment for MRSA infections. Test performance is not FDA approved in patients less than 74 years old. Performed at [facility]  Lab, [address] [employer] [place], [place] [address]      Radiology Studies: No results found.   Marzetta Board, MD, PhD [facility]  Between 7 am - 7 pm I am available, please contact me via Amion (for emergencies) or Securechat (non urgent messages)  Between 7 pm - 7 am I am not available, please contact night coverage MD/APP via Amion

## 2022-07-19 NOTE — Progress Notes (Signed)
Pharmacy Antibiotic Note  Hunter Lewis is a 67 y.o. male admitted on 07/17/2022 with pneumonia. Pharmacy has been consulted for Zosyn dosing. CT findings c/w necrotizing pneumonia.   WBC elevated at 21, afebrile  Plan: Zosyn 3.375g IV q8 hours (extended infusion) Continue azithromycin per MD F/u culture data, clinical improvement  Height: 6' (182.9 cm) Weight: 109.4 kg (241 lb 2.9 oz) IBW/kg (Calculated) : 77.6  Temp (24hrs), Avg:97.9 F (36.6 C), Min:97.7 F (36.5 C), Max:98.4 F (36.9 C)  Recent Labs  Lab 07/17/22 1140 07/18/22 0339 07/19/22 0304  WBC 16.4* 13.8* 21.8*  CREATININE 2.31* 2.35* 1.96*  LATICACIDVEN 1.7  --   --   VANCORANDOM  --   --  12     Estimated Creatinine Clearance: 48.6 mL/min (A) (by C-G formula based on SCr of 1.96 mg/dL (H)).    No Known Allergies  Antimicrobials this admission: 2/1 ceftriaxone >> 2/3 2/1 azithromycin >> 2/1 vancomycin x1 2/3 zosyn >>   Dose adjustments this admission:   Microbiology results: 2/1 BCx: ngtd 2/1 UCx: 60K yeast 2/1 RVP: neg 2/1 Sputum Cx: not acceptable for testing 2/1 MRSA PCR: ordered  2/3 Legionella: 2/3 Strep pneumo: 2/3 Resp panel:  Thank you for allowing pharmacy to be a part of this patient's care.  Dimple Nanas, PharmD, BCPS 07/19/2022 12:14 PM

## 2022-07-19 NOTE — Progress Notes (Signed)
Pt noted to be tachy at 2330, pt observed to be resting with eyes closed. pt stated "I feel my heart beating but I don't know why". Vital signs taken and charted, EKG obtained & placed in chart  Contacted NP on call, given orders for 13mL Cardizem IV  0030: 37mL Cardizem given, HR down to 110's

## 2022-07-20 DIAGNOSIS — I5020 Unspecified systolic (congestive) heart failure: Secondary | ICD-10-CM

## 2022-07-20 DIAGNOSIS — I480 Paroxysmal atrial fibrillation: Secondary | ICD-10-CM

## 2022-07-20 DIAGNOSIS — J850 Gangrene and necrosis of lung: Secondary | ICD-10-CM | POA: Diagnosis not present

## 2022-07-20 LAB — BASIC METABOLIC PANEL
Anion gap: 9 (ref 5–15)
BUN: 94 mg/dL — ABNORMAL HIGH (ref 8–23)
CO2: 25 mmol/L (ref 22–32)
Calcium: 8.1 mg/dL — ABNORMAL LOW (ref 8.9–10.3)
Chloride: 100 mmol/L (ref 98–111)
Creatinine, Ser: 1.67 mg/dL — ABNORMAL HIGH (ref 0.61–1.24)
GFR, Estimated: 45 mL/min — ABNORMAL LOW (ref >60)
Glucose, Bld: 73 mg/dL (ref 70–99)
Potassium: 4.1 mmol/L (ref 3.5–5.1)
Sodium: 134 mmol/L — ABNORMAL LOW (ref 135–145)

## 2022-07-20 LAB — GLUCOSE, CAPILLARY
Glucose-Capillary: 112 mg/dL — ABNORMAL HIGH (ref 70–99)
Glucose-Capillary: 268 mg/dL — ABNORMAL HIGH (ref 70–99)
Glucose-Capillary: 62 mg/dL — ABNORMAL LOW (ref 70–99)
Glucose-Capillary: 81 mg/dL (ref 70–99)
Glucose-Capillary: 96 mg/dL (ref 70–99)

## 2022-07-20 LAB — CBC
HCT: 49.5 % (ref 39.0–52.0)
Hemoglobin: 15.6 g/dL (ref 13.0–17.0)
MCH: 26.9 pg (ref 26.0–34.0)
MCHC: 31.5 g/dL (ref 30.0–36.0)
MCV: 85.5 fL (ref 80.0–100.0)
Platelets: 205 10*3/uL (ref 150–400)
RBC: 5.79 MIL/uL (ref 4.22–5.81)
RDW: 18.1 % — ABNORMAL HIGH (ref 11.5–15.5)
WBC: 15.6 10*3/uL — ABNORMAL HIGH (ref 4.0–10.5)
nRBC: 0 % (ref 0.0–0.2)

## 2022-07-20 LAB — EXPECTORATED SPUTUM ASSESSMENT W GRAM STAIN, RFLX TO RESP C

## 2022-07-20 LAB — HEPARIN LEVEL (UNFRACTIONATED)
Heparin Unfractionated: <0.1 IU/mL — ABNORMAL LOW (ref 0.30–0.70)
Heparin Unfractionated: 0.21 IU/mL — ABNORMAL LOW (ref 0.30–0.70)
Heparin Unfractionated: 0.1 IU/mL — ABNORMAL LOW (ref 0.30–0.70)

## 2022-07-20 LAB — MAGNESIUM: Magnesium: 2.9 mg/dL — ABNORMAL HIGH (ref 1.7–2.4)

## 2022-07-20 LAB — PHOSPHORUS: Phosphorus: 5.5 mg/dL — ABNORMAL HIGH (ref 2.5–4.6)

## 2022-07-20 MED ORDER — EMPAGLIFLOZIN 10 MG PO TABS
10.0000 mg | ORAL_TABLET | Freq: Every day | ORAL | Status: DC
  Administered 2022-07-20 – 2022-07-21 (×2): 10 mg via ORAL
  Filled 2022-07-20 (×2): qty 1
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
## 2022-07-20 NOTE — Progress Notes (Signed)
ANTICOAGULATION CONSULT NOTE - Initial Consult  Pharmacy Consult for heparin Indication: atrial fibrillation  No Known Allergies  Patient Measurements: Height: 6' (182.9 cm) Weight: 109.4 kg (241 lb 2.9 oz) IBW/kg (Calculated) : 77.6 Heparin Dosing Weight: 100.8 kg  Vital Signs: Temp: 97.9 F (36.6 C) (02/03 2345) Temp Source: Oral (02/03 2345) BP: 99/52 (02/03 2345) Pulse Rate: 104 (02/03 2345)  Labs: Recent Labs    07/17/22 1140 07/17/22 1340 07/18/22 0339 07/19/22 0304 07/19/22 1148 07/19/22 1656 07/20/22 0029  HGB 13.3  --  13.5 15.0  --   --  15.6  HCT 42.3  --  42.0 46.7  --   --  49.5  PLT 180  --  178 244  --   --  205  APTT 29  --   --   --   --   --   --   LABPROT 18.8*  --  18.6*  --   --   --   --   INR 1.6*  --  1.6*  --   --   --   --   HEPARINUNFRC  --   --   --   --   --  <0.10* <0.10*  CREATININE 2.31*  --  2.35* 1.96*  --   --   --   TROPONINIHS 5 7  --   --  7  --   --      Estimated Creatinine Clearance: 48.6 mL/min (A) (by C-G formula based on SCr of 1.96 mg/dL (H)).   Medical History: Past Medical History:  Diagnosis Date   Arthritis    hands    Asthma    Chronic diastolic heart failure (HCC)    Complication of anesthesia    slow to awaken   Diabetes mellitus without complication ([place])    Type II   Dyspnea    with asthma- last issue  was early September   Hypertension    Neuropathy    feet   Pneumonia 2017   2017- hospitalized [place], 2018  did not require hospitalization    Medications:  Scheduled:   aspirin EC  81 mg Oral Daily   insulin aspart  0-15 Units Subcutaneous TID WC   insulin aspart  20 Units Subcutaneous TID WC   insulin glargine-yfgn  35 Units Subcutaneous QHS   ipratropium  0.5 mg Nebulization Q6H WA   metoprolol succinate  100 mg Oral Daily   pantoprazole  40 mg Oral Daily   rosuvastatin  10 mg Oral Daily   sodium chloride flush  3 mL Intravenous Q12H    Assessment: 67 yo male with new onset afib  w/ RVR likely in setting of necrotizing PNA. CHADs-VASc = 2. Pharmacy consulted to begin IV heparin with no bolus given history of hemoptysis. No AC pta - received enox 40mg  here for vte ppx (LD 2/2 @ 1654) INR 1.6, CBC stable/wnl  Heparin level came back undetectable again. We will increase rate and check another level in AM. No bleeding per Rn.   Goal of Therapy:  Heparin level 0.3-0.7 units/ml Monitor platelets by anticoagulation protocol: Yes   Plan:  No bolus given concern for hemoptysis Increase heparin drip to 1950 units/hr  Check heparin level in 8 hours Daily heparin level, CBC  Onnie Boer, PharmD, BCIDP, AAHIVP, CPP Infectious Disease Pharmacist 07/20/2022 1:21 AM
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
## 2022-07-20 NOTE — Progress Notes (Signed)
ANTICOAGULATION CONSULT NOTE - Follow up  Pharmacy Consult for heparin Indication: atrial fibrillation  No Known Allergies  Patient Measurements: Height: 6' (182.9 cm) Weight: 109.4 kg (241 lb 2.9 oz) IBW/kg (Calculated) : 77.6 Heparin Dosing Weight: 100.8 kg  Vital Signs: Temp: 97.7 F (36.5 C) (02/04 1544) Temp Source: Oral (02/04 1544) BP: 120/67 (02/04 1544) Pulse Rate: 74 (02/04 1544)  Labs: Recent Labs    07/18/22 0339 07/19/22 0304 07/19/22 1148 07/19/22 1656 07/20/22 0029 07/20/22 1001 07/20/22 1811  HGB 13.5 15.0  --   --  15.6  --   --   HCT 42.0 46.7  --   --  49.5  --   --   PLT 178 244  --   --  205  --   --   LABPROT 18.6*  --   --   --   --   --   --   INR 1.6*  --   --   --   --   --   --   HEPARINUNFRC  --   --   --    < > <0.10* 0.21* 0.10*  CREATININE 2.35* 1.96*  --   --  1.67*  --   --   TROPONINIHS  --   --  7  --   --   --   --    < > = values in this interval not displayed.     Estimated Creatinine Clearance: 57.1 mL/min (A) (by C-G formula based on SCr of 1.67 mg/dL (H)).   Medical History: Past Medical History:  Diagnosis Date   Arthritis    hands    Asthma    Chronic diastolic heart failure ([facility])    Complication of anesthesia    slow to awaken   Diabetes mellitus without complication ([place])    Type II   Dyspnea    with asthma- last issue  was early September   Hypertension    Neuropathy    feet   Pneumonia 2017   2017- hospitalized [place], 2018  did not require hospitalization    Medications:  Scheduled:   aspirin EC  81 mg Oral Daily   empagliflozin  10 mg Oral Daily   insulin aspart  0-15 Units Subcutaneous TID WC   insulin aspart  20 Units Subcutaneous TID WC   insulin glargine-yfgn  35 Units Subcutaneous QHS   ipratropium  0.5 mg Nebulization Q6H WA   metoprolol succinate  100 mg Oral Daily   pantoprazole  40 mg Oral Daily   rosuvastatin  10 mg Oral Daily   sodium chloride flush  3 mL Intravenous Q12H     Assessment: 67 yo male with new onset afib w/ RVR likely in setting of necrotizing PNA. CHADs-VASc = 2. Pharmacy consulted to begin IV heparin with no bolus given history of hemoptysis. No AC pta - received enox 40mg  here for vte ppx (LD 2/2 @ 1654) INR 1.6, CBC stable/wnl  07/20/22 AM :  Heparin level low at 0.21. No issues with heparin infusing or bleeding per RN. > thus heparin rate increased.   UPDATE:   2nd shift follow-up 07/20/22 Heparin level dropped to 0.1,  approximate 6 hours after heparin rate increased to 2150 units/hr No issues/ interruptions with heparin infusion per RN and confirmed with patient that lab drew HL from [place].  Heparin is infusing in LUE.   No active bleeding noted except dried blood in nostril thought to be secondary to  on O2. Noted above h/o hemoptysis.   Goal of Therapy:  Heparin level 0.3-0.7 units/ml Monitor platelets by anticoagulation protocol: Yes   Plan:  No bolus given concern for hemoptysis Increase heparin drip to 2350 units/hr  Check heparin level in 6 hours Daily heparin level, CBC   Nicole Cella, RPh Clinical Pharmacist 07/20/2022 7:00 PM

## 2022-07-20 NOTE — Consult Note (Addendum)
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 CT on admission with concern for aspiration -- On IV antibiotics management per primary/PCCM -- Chronically on 3 L nasal cannula at night, still at 7 L nasal cannula currently -- Speech therapy following with concerns for aspiration, pending barium swallow  HFmrEF -- Echo showed LVEF of 40 to 45%, global hypokinesis, normal RV size and function, trivial MR -- No signs of volume overload on exam -- Denies any anginal symptoms prior to admission, suspect LV may be down in the setting of acute illness? -- GDMT: Metoprolol XL 100 mg daily, recommend addition of SGLT2i, defer initiation of Entresto/spironolactone now as his renal function remains elevated above baseline.  Hypertension -- Initially hypotensive on admission requiring 3 L bolus as well as continuous IV fluids.  Blood pressure has now improved -- Continue metoprolol XL 100 mg  daily  Hyperlipidemia --    Risk Assessment/Risk Scores:   CHA2DS2-VASc Score = 3   This indicates a 3.2% annual risk of stroke. The patient's score is based upon: CHF History: 1 HTN History: 1 Diabetes History: 1 Stroke History: 0 Vascular Disease History: 0 Age Score: 0 Gender Score: 0    For  questions or updates, please contact [facility] Please consult [email or web address] for contact info under    Signed, Laverda Page, NP  07/20/2022 12:59 PM    Attending Note:   The patient was seen and examined.  Agree with assessment and plan as noted above.  Changes made to the above note as needed.  Patient seen and independently examined with Laverda Page, NP .   We discussed all aspects of the encounter. I agree with the assessment and plan as stated above.    Jessey was admitted with pneumonia / sepsis.   Developed atrial fib with RVR .  He has converted to NSR since this morning .   He is on heparin. I would send him home on Eliquis 5 BID   2.  HFrEF .  EF is 45-50%. Cont toprol XL ,  would hold off on ARB or spiro due to his renal dysfunction   3.  HTN:  bp is currently well controlled.     I have spent a total of 40 minutes with patient reviewing hospital  notes , telemetry, EKGs, labs and examining patient as well as establishing an assessment and plan that was discussed with the patient.  > 50% of time was spent in direct patient care.    Vesta Mixer, Montez Hageman., MD, [facility] 07/20/2022, 1:35 PM [address]  [address] Office [phone] Pager [phone]

## 2022-07-20 NOTE — Progress Notes (Signed)
PROGRESS NOTE  Hunter Lewis ZOX:[id number] DOB: 09/03/1955 DOA: 07/17/2022 PCP: [facility]   LOS: 3 days   Brief Narrative / Interim history: Hunter Lewis is a 67 y.o. male with medical history significant of asthma, chronic hypoxic respiratory failure with nightly O2 of 3L, hypertension, diabetes, diastolic CHF, OSA presenting with shortness of breath.  He tells me that this has been going on for the past 3 to 4 days, more so in the last 24 hours.  He reports subjective fevers and chills.  He also has been experiencing burning with urination for the past 2 to 3 days.  Covid, flow, RSV all negative.  Imaging on admission with a CT scan of the chest showed bilateral opacities left greater than right with a masslike/cavitary area at the left lower lobe most suggestive of aspiration sequela resulting in necrotizing pneumonia.  He also has a chronic left pleural effusion.  Subjective / 24h Interval events: Overall feeling much better this morning.  Slept better overnight and feels like his breathing is improved.  Denies any chest pains  Assesement and Plan: Principal Problem:   Necrotizing pneumonia ([place]) Active Problems:   Essential hypertension   Type 2 diabetes mellitus without complication (HCC)   Chronic diastolic congestive heart failure (HCC)   Asthma, chronic, unspecified asthma severity, with acute exacerbation   Acute on chronic respiratory failure with hypoxia (HCC)   UTI (urinary tract infection)   Principal problem Sepsis due to necrotizing pneumonia, underlying unspecified chronic lung disease - based on the CT scan, there was concern for aspiration given debris in the bronchus however clinically he has no history of aspiration and no apparent risk factors.  Met sepsis criteria with elevated WBC, hypotension, tachypnea, source. -In reviewing his chart, he used to follow with Dr. Dan Europe with [facility] pulmonary however apparently his pulmonologist left and  currently is seeing no one.  Pulmonary consulted, appreciate input.  Defer to pulmonary timing of bronc.  Continue Zosyn for now, remains hypoxic  Active problems Acute on chronic hypoxic respiratory failure, asthma exacerbation -he does have a long history of asthma, and apparently this runs in the family.  He never smoked.  He is using 3 L at home at night, but generally he is on room air during daytime.  Remains hypoxic this morning, still on 9 L high flow  A-fib with RVR, acute systolic CHF-A-fib is new, likely in the setting of #1.  His rates are poorly controlled on current metoprolol dose, and blood pressures are soft so would not uptitrate.  2D echo done shows a depressed EF of 40-45% with global LV hypokinesis.  Cardiology consulted, appreciate input.  Due to CHA2DS2-VASc 2 score of 2, he was started on heparin infusion.  Once getting closer to discharge we will switch to NOAC  Acute kidney injury on CKD 3A - baseline creatinine about 1.3 per chart review, up to 2.3 on admission.  His creatinine continues to improve, 1.6 today  Hypertension - Holding home amlodipine, Lasix, lisinopril-hydrochlorothiazide in the setting of hypotension initially.  Continue metoprolol.   Hyperlipidemia - Continue on fenofibrate  Elevated bilirubin, isolated -unclear significance.  Initial CT scan raised concern for cirrhosis however liver parenchyma appears unremarkable on right upper quadrant ultrasound.  Bilirubin normalized  Diabetes mellitus, poorly controlled, with hyperglycemia-continue long-acting and sliding scale, CBGs reviewed and are acceptable  CBG (last 3)  Recent Labs    07/19/22 1519 07/19/22 2112 07/20/22 0729  GLUCAP 159* 102* 112*  Lab Results  Component Value Date   HGBA1C 7.6 (H) 07/17/2022     Scheduled Meds:  aspirin EC  81 mg Oral Daily   insulin aspart  0-15 Units Subcutaneous TID WC   insulin aspart  20 Units Subcutaneous TID WC   insulin glargine-yfgn  35 Units  Subcutaneous QHS   ipratropium  0.5 mg Nebulization Q6H WA   metoprolol succinate  100 mg Oral Daily   pantoprazole  40 mg Oral Daily   rosuvastatin  10 mg Oral Daily   sodium chloride flush  3 mL Intravenous Q12H   Continuous Infusions:  azithromycin (ZITHROMAX) 500 mg in sodium chloride 0.9 % 250 mL IVPB Stopped (07/19/22 1120)   heparin 1,950 Units/hr (07/20/22 0359)   piperacillin-tazobactam (ZOSYN)  IV 3.375 g (07/20/22 0538)   PRN Meds:.acetaminophen **OR** acetaminophen, albuterol, diphenhydrAMINE, polyethylene glycol, sodium chloride  Current Outpatient Medications  Medication Instructions   amLODipine (NORVASC) 10 mg, Oral, Daily   aspirin EC 81 mg, Oral, Daily   Basaglar KwikPen 60 Units, Subcutaneous, Daily at bedtime   empagliflozin (JARDIANCE) 25 mg, Oral, Daily   furosemide (LASIX) 40 mg, Oral, Daily   insulin lispro (HUMALOG KWIKPEN) 45 Units, Subcutaneous, 3 times daily with meals   metoprolol succinate (TOPROL-XL) 100 mg, Oral, Daily, Take with or immediately following a meal.   rosuvastatin (CRESTOR) 10 mg, Oral, Daily   tamsulosin (FLOMAX) 0.4 mg, Oral, Daily    Diet Orders (From admission, onward)     Start     Ordered   07/17/22 1513  Diet heart healthy/carb modified Room service appropriate? Yes; Fluid consistency: Thin  Diet effective now       Question Answer Comment  Diet-HS Snack? Nothing   Room service appropriate? Yes   Fluid consistency: Thin      07/17/22 1517            DVT prophylaxis:    Lab Results  Component Value Date   PLT 205 07/20/2022      Code Status: Full Code  Family Communication: no family at bedside, updated wife over the phone  Status is: Inpatient  Remains inpatient appropriate because: severity of illness  Level of care: Progressive  Consultants:  Pulmonary   Objective: Vitals:   07/19/22 2345 07/20/22 0357 07/20/22 0721 07/20/22 0806  BP: (!) 99/52 96/80 107/71   Pulse: (!) 104 (!) 122 (!) 109 (!)  106  Resp: 18  20 20   Temp: 97.9 F (36.6 C) 97.6 F (36.4 C) 98.2 F (36.8 C)   TempSrc: Oral Oral Oral   SpO2: 90% 91% 93% 95%  Weight:      Height:        Intake/Output Summary (Last 24 hours) at 07/20/2022 0916 Last data filed at 07/20/2022 0359 Gross per 24 hour  Intake 710 ml  Output 1500 ml  Net -790 ml    Wt Readings from Last 3 Encounters:  07/19/22 109.4 kg  03/16/17 112.9 kg  03/13/17 112.9 kg    Examination:  Constitutional: NAD Eyes: lids and conjunctivae normal, no scleral icterus ENMT: mmm Neck: normal, supple Respiratory: Diminished on the left lung field, no wheezing Cardiovascular: Irregularly irregular Abdomen: soft, no distention, no tenderness. Bowel sounds positive.  Skin: no rashes Neurologic: no focal deficits, equal strength   Data Reviewed: I have independently reviewed following labs and imaging studies   CBC Recent Labs  Lab 07/17/22 1140 07/18/22 0339 07/19/22 0304 07/20/22 0029  WBC 16.4* 13.8* 21.8* 15.6*  HGB  13.3 13.5 15.0 15.6  HCT 42.3 42.0 46.7 49.5  PLT 180 178 244 205  MCV 84.8 85.0 84.1 85.5  MCH 26.7 27.3 27.0 26.9  MCHC 31.4 32.1 32.1 31.5  RDW 17.9* 17.7* 17.4* 18.1*  LYMPHSABS 0.2*  --   --   --   MONOABS 1.3*  --   --   --   EOSABS 0.0  --   --   --   BASOSABS 0.0  --   --   --      Recent Labs  Lab 07/17/22 1113 07/17/22 1140 07/17/22 1614 07/17/22 1818 07/18/22 0339 07/19/22 0304 07/19/22 1148 07/20/22 0029  NA  --  131*  --   --  132* 130*  --  134*  K  --  4.2  --   --  4.0 4.3  --  4.1  CL  --  94*  --   --  95* 94*  --  100  CO2  --  24  --   --  25 21*  --  25  GLUCOSE  --  191*  --   --  284* 208*  --  73  BUN  --  59*  --   --  78* 96*  --  94*  CREATININE  --  2.31*  --   --  2.35* 1.96*  --  1.67*  CALCIUM  --  7.8*  --   --  7.7* 8.0*  --  8.1*  AST  --  24 29  --  71* 31  --   --   ALT  --  25 29  --  63* 74*  --   --   ALKPHOS  --  82 63  --  66 88  --   --   BILITOT  --  1.9*  1.3*  --  1.0 0.8  --   --   ALBUMIN  --  2.7* 2.4*  --  2.3* 2.3*  --   --   MG  --   --   --   --   --  2.9*  --  2.9*  PROCALCITON  --   --  7.09  --  7.64 4.95  --   --   LATICACIDVEN  --  1.7  --   --   --   --   --   --   INR  --  1.6*  --   --  1.6*  --   --   --   HGBA1C  --   --   --  7.6*  --   --   --   --   BNP 119.8*  --   --   --   --   --  339.7*  --      ------------------------------------------------------------------------------------------------------------------ No results for input(s): "CHOL", "HDL", "LDLCALC", "TRIG", "CHOLHDL", "LDLDIRECT" in the last 72 hours.  Lab Results  Component Value Date   HGBA1C 7.6 (H) 07/17/2022   ------------------------------------------------------------------------------------------------------------------ No results for input(s): "TSH", "T4TOTAL", "T3FREE", "THYROIDAB" in the last 72 hours.  Invalid input(s): "FREET3"  Cardiac Enzymes No results for input(s): "CKMB", "TROPONINI", "MYOGLOBIN" in the last 168 hours.  Invalid input(s): "CK" ------------------------------------------------------------------------------------------------------------------    Component Value Date/Time   BNP 339.7 (H) 07/19/2022 1148    CBG: Recent Labs  Lab 07/19/22 0824 07/19/22 1148 07/19/22 1519 07/19/22 2112 07/20/22 0729  GLUCAP 210* 239* 159* 102* 112*     Recent Results (from the  past 240 hour(s))  Resp panel by RT-PCR (RSV, Flu A&B, Covid) Anterior Nasal Swab     Status: None   Collection Time: 07/17/22 11:13 AM   Specimen: Anterior Nasal Swab  Result Value Ref Range Status   SARS Coronavirus 2 by RT PCR NEGATIVE NEGATIVE Final   Influenza A by PCR NEGATIVE NEGATIVE Final   Influenza B by PCR NEGATIVE NEGATIVE Final    Comment: (NOTE) The Xpert Xpress SARS-CoV-2/FLU/RSV plus assay is intended as an aid in the diagnosis of influenza from Nasopharyngeal swab specimens and should not be used as a sole basis for treatment.  Nasal washings and aspirates are unacceptable for Xpert Xpress SARS-CoV-2/FLU/RSV testing.  Fact Sheet for Patients: BloggerCourse.com  Fact Sheet for Healthcare Providers: SeriousBroker.it  This test is not yet approved or cleared by the [place] FDA and has been authorized for detection and/or diagnosis of SARS-CoV-2 by FDA under an Emergency Use Authorization (EUA). This EUA will remain in effect (meaning this test can be used) for the duration of the COVID-19 declaration under Section 564(b)(1) of the Act, 21 U.S.C. section 360bbb-3(b)(1), unless the authorization is terminated or revoked.     Resp Syncytial Virus by PCR NEGATIVE NEGATIVE Final    Comment: (NOTE) Fact Sheet for Patients: BloggerCourse.com  Fact Sheet for Healthcare Providers: SeriousBroker.it  This test is not yet approved or cleared by the [place] FDA and has been authorized for detection and/or diagnosis of SARS-CoV-2 by FDA under an Emergency Use Authorization (EUA). This EUA will remain in effect (meaning this test can be used) for the duration of the COVID-19 declaration under Section 564(b)(1) of the Act, 21 U.S.C. section 360bbb-3(b)(1), unless the authorization is terminated or revoked.  Performed at [facility], [address] [place], [place] [address]   Blood Culture (routine x 2)     Status: None (Preliminary result)   Collection Time: 07/17/22 11:40 AM   Specimen: BLOOD  Result Value Ref Range Status   Specimen Description BLOOD RIGHT ANTECUBITAL  Final   Special Requests   Final    BOTTLES DRAWN AEROBIC AND ANAEROBIC Blood Culture adequate volume   Culture   Final    NO GROWTH 3 DAYS Performed at [facility], [address] [place], [place] [address]    Report Status PENDING  Incomplete  Urine Culture     Status: Abnormal   Collection Time: 07/17/22 11:40 AM    Specimen: Urine, Clean Catch  Result Value Ref Range Status   Specimen Description URINE, CLEAN CATCH  Final   Special Requests NONE Reflexed from [id number]  Final   Culture (A)  Final    60,000 COLONIES/mL YEAST Performed at [facility], [address] [place], [place] [address]    Report Status 07/18/2022 FINAL  Final  Blood Culture (routine x 2)     Status: None (Preliminary result)   Collection Time: 07/17/22 11:58 AM   Specimen: BLOOD LEFT HAND  Result Value Ref Range Status   Specimen Description BLOOD LEFT HAND  Final   Special Requests   Final    BOTTLES DRAWN AEROBIC AND ANAEROBIC Blood Culture adequate volume   Culture   Final    NO GROWTH 3 DAYS Performed at [facility], [address] [place], [place] [address]    Report Status PENDING  Incomplete  MRSA Next Gen by PCR, Nasal     Status: None   Collection Time: 07/17/22  3:28  PM   Specimen: Nasal Mucosa; Nasal Swab  Result Value Ref Range Status   MRSA by PCR Next Gen NOT DETECTED NOT DETECTED Final    Comment: (NOTE) The GeneXpert MRSA Assay (FDA approved for NASAL specimens only), is one component of a comprehensive MRSA colonization surveillance program. It is not intended to diagnose MRSA infection nor to guide or monitor treatment for MRSA infections. Test performance is not FDA approved in patients less than 80 years old. Performed at [facility], [place] [address] [place], [place] [address]   Expectorated Sputum Assessment w Gram Stain, Rflx to Resp Cult     Status: None (Preliminary result)   Collection Time: 07/19/22  7:49 AM   Specimen: Expectorated Sputum  Result Value Ref Range Status   Specimen Description EXPECTORATED SPUTUM  Final   Special Requests NONE  Final   Sputum evaluation   Final    Sputum specimen not acceptable for testing.  Please recollect.    C/ Natale Lay, RN 07/19/22 1205 A. LAFRANCE Performed at [facility], [place] [address] [place], [place] [address]     Report Status PENDING  Incomplete  Respiratory (~20 pathogens) panel by PCR     Status: Abnormal   Collection Time: 07/19/22 11:28 AM   Specimen: Nasopharyngeal Swab; Respiratory  Result Value Ref Range Status   Adenovirus NOT DETECTED NOT DETECTED Final   Coronavirus 229E NOT DETECTED NOT DETECTED Final    Comment: (NOTE) The Coronavirus on the Respiratory Panel, DOES NOT test for the novel  Coronavirus (2019 nCoV)    Coronavirus HKU1 NOT DETECTED NOT DETECTED Final   Coronavirus NL63 NOT DETECTED NOT DETECTED Final   Coronavirus OC43 DETECTED (A) NOT DETECTED Final   Metapneumovirus NOT DETECTED NOT DETECTED Final   Rhinovirus / Enterovirus NOT DETECTED NOT DETECTED Final   Influenza A NOT DETECTED NOT DETECTED Final   Influenza B NOT DETECTED NOT DETECTED Final   Parainfluenza Virus 1 NOT DETECTED NOT DETECTED Final   Parainfluenza Virus 2 NOT DETECTED NOT DETECTED Final   Parainfluenza Virus 3 NOT DETECTED NOT DETECTED Final   Parainfluenza Virus 4 NOT DETECTED NOT DETECTED Final   Respiratory Syncytial Virus NOT DETECTED NOT DETECTED Final   Bordetella pertussis NOT DETECTED NOT DETECTED Final   Bordetella Parapertussis NOT DETECTED NOT DETECTED Final   Chlamydophila pneumoniae NOT DETECTED NOT DETECTED Final   Mycoplasma pneumoniae NOT DETECTED NOT DETECTED Final    Comment: Performed at [facility], [place]. [address] [place], [place] [address]  Expectorated Sputum Assessment w Gram Stain, Rflx to Resp Cult     Status: None (Preliminary result)   Collection Time: 07/19/22  3:16 PM   Specimen: Expectorated Sputum  Result Value Ref Range Status   Specimen Description EXPECTORATED SPUTUM  Final   Special Requests NONE  Final   Sputum evaluation   Final    THIS SPECIMEN IS ACCEPTABLE FOR SPUTUM CULTURE Performed at [facility], [place] [address], [place], [place] [address]    Report Status PENDING  Incomplete  Culture, Respiratory w Gram Stain     Status: None  (Preliminary result)   Collection Time: 07/19/22  3:16 PM  Result Value Ref Range Status   Specimen Description EXPECTORATED SPUTUM  Final   Special Requests NONE Reflexed from [id number]  Final   Gram Stain   Final    MODERATE WBC PRESENT, PREDOMINANTLY MONONUCLEAR FEW GRAM NEGATIVE RODS RARE YEAST Performed at [facility], [address], [place], [place] [address]    Culture PENDING  Incomplete   Report Status PENDING  Incomplete     Radiology Studies: DG CHEST PORT 1 VIEW  Result Date: 07/19/2022 CLINICAL DATA:  Hypoxia EXAM: PORTABLE CHEST 1 VIEW COMPARISON:  06/16/22 CXR FINDINGS: Right lung is unchanged in appearance with persistent prominent interstitial opacities. Compared to prior exam there is now near-complete opacification of the left hemithorax. There is no evidence of mediastinal shift suggest a cause, which can be secondary to mucous plugging/atelectasis, or interval development of a new large left pleural effusion. There is a mild upward displacement of the left mainstem bronchus, which could suggest left upper lobe collapse. Visualized upper abdomen is unremarkable. Cardiac contours are poorly visualized. No radiographically apparent displaced rib fracture. IMPRESSION: Compared to prior exam there is now near-complete opacification of the left hemithorax. There is no evidence of mediastinal shift to definitive suggest a cause, which could be secondary to mucous plugging/atelectasis, or interval development of a new large left pleural effusion. There is a mild upward displacement of the left mainstem bronchus, which could suggest left upper lobe collapse. Recommend chest CT for further evaluation. These results will be called to the ordering clinician or representative by the Radiologist Assistant, and communication documented in the PACS or [employer]. Electronically Signed   By: Lorenza Cambridge M.D.   On: 07/19/2022 11:33     Pamella Pert, MD, PhD [facility]  Between 7 am - 7 pm I am available, please contact me via Amion (for emergencies) or Securechat (non urgent messages)  Between 7 pm - 7 am I am not available, please contact night coverage MD/APP via Amion

## 2022-07-20 NOTE — Progress Notes (Addendum)
ANTICOAGULATION CONSULT NOTE - Initial Consult  Pharmacy Consult for heparin Indication: atrial fibrillation  No Known Allergies  Patient Measurements: Height: 6' (182.9 cm) Weight: 109.4 kg (241 lb 2.9 oz) IBW/kg (Calculated) : 77.6 Heparin Dosing Weight: 100.8 kg  Vital Signs: Temp: 98.2 F (36.8 C) (02/04 0721) Temp Source: Oral (02/04 0721) BP: 107/71 (02/04 0721) Pulse Rate: 106 (02/04 0806)  Labs: Recent Labs    07/17/22 1140 07/17/22 1340 07/18/22 0339 07/19/22 0304 07/19/22 1148 07/19/22 1656 07/20/22 0029 07/20/22 1001  HGB 13.3  --  13.5 15.0  --   --  15.6  --   HCT 42.3  --  42.0 46.7  --   --  49.5  --   PLT 180  --  178 244  --   --  205  --   APTT 29  --   --   --   --   --   --   --   LABPROT 18.8*  --  18.6*  --   --   --   --   --   INR 1.6*  --  1.6*  --   --   --   --   --   HEPARINUNFRC  --   --   --   --   --  <0.10* <0.10* 0.21*  CREATININE 2.31*  --  2.35* 1.96*  --   --  1.67*  --   TROPONINIHS 5 7  --   --  7  --   --   --      Estimated Creatinine Clearance: 57.1 mL/min (A) (by C-G formula based on SCr of 1.67 mg/dL (H)).   Medical History: Past Medical History:  Diagnosis Date   Arthritis    hands    Asthma    Chronic diastolic heart failure (HCC)    Complication of anesthesia    slow to awaken   Diabetes mellitus without complication ([place])    Type II   Dyspnea    with asthma- last issue  was early September   Hypertension    Neuropathy    feet   Pneumonia 2017   2017- hospitalized [place], 2018  did not require hospitalization    Medications:  Scheduled:   aspirin EC  81 mg Oral Daily   insulin aspart  0-15 Units Subcutaneous TID WC   insulin aspart  20 Units Subcutaneous TID WC   insulin glargine-yfgn  35 Units Subcutaneous QHS   ipratropium  0.5 mg Nebulization Q6H WA   metoprolol succinate  100 mg Oral Daily   pantoprazole  40 mg Oral Daily   rosuvastatin  10 mg Oral Daily   sodium chloride flush  3 mL  Intravenous Q12H    Assessment: 67 yo male with new onset afib w/ RVR likely in setting of necrotizing PNA. CHADs-VASc = 2. Pharmacy consulted to begin IV heparin with no bolus given history of hemoptysis. No AC pta - received enox 40mg  here for vte ppx (LD 2/2 @ 1654) INR 1.6, CBC stable/wnl  Heparin level low at 0.21. No issues with heparin infusing or bleeding per RN.   Goal of Therapy:  Heparin level 0.3-0.7 units/ml Monitor platelets by anticoagulation protocol: Yes   Plan:  No bolus given concern for hemoptysis Increase heparin drip to 2150 units/hr  Check heparin level in 6 hours Daily heparin level, CBC  Dimple Nanas, PharmD, BCPS 07/20/2022 10:40 AM

## 2022-07-21 ENCOUNTER — Inpatient Hospital Stay (HOSPITAL_COMMUNITY): Payer: No Typology Code available for payment source

## 2022-07-21 ENCOUNTER — Other Ambulatory Visit (HOSPITAL_COMMUNITY): Payer: Self-pay

## 2022-07-21 DIAGNOSIS — J850 Gangrene and necrosis of lung: Secondary | ICD-10-CM | POA: Diagnosis not present

## 2022-07-21 LAB — CBC
HCT: 48.9 % (ref 39.0–52.0)
Hemoglobin: 14.9 g/dL (ref 13.0–17.0)
MCH: 26.6 pg (ref 26.0–34.0)
MCHC: 30.5 g/dL (ref 30.0–36.0)
MCV: 87.3 fL (ref 80.0–100.0)
Platelets: 148 10*3/uL — ABNORMAL LOW (ref 150–400)
RBC: 5.6 MIL/uL (ref 4.22–5.81)
RDW: 17.4 % — ABNORMAL HIGH (ref 11.5–15.5)
WBC: 11.2 10*3/uL — ABNORMAL HIGH (ref 4.0–10.5)
nRBC: 0 % (ref 0.0–0.2)

## 2022-07-21 LAB — HEPARIN LEVEL (UNFRACTIONATED): Heparin Unfractionated: 0.45 IU/mL (ref 0.30–0.70)

## 2022-07-21 LAB — GLUCOSE, CAPILLARY
Glucose-Capillary: 151 mg/dL — ABNORMAL HIGH (ref 70–99)
Glucose-Capillary: 143 mg/dL — ABNORMAL HIGH (ref 70–99)

## 2022-07-21 LAB — COMPREHENSIVE METABOLIC PANEL
ALT: 46 U/L — ABNORMAL HIGH (ref 0–44)
AST: 15 U/L (ref 15–41)
Albumin: 2.3 g/dL — ABNORMAL LOW (ref 3.5–5.0)
Alkaline Phosphatase: 70 U/L (ref 38–126)
Anion gap: 8 (ref 5–15)
BUN: 61 mg/dL — ABNORMAL HIGH (ref 8–23)
CO2: 28 mmol/L (ref 22–32)
Calcium: 8.4 mg/dL — ABNORMAL LOW (ref 8.9–10.3)
Chloride: 103 mmol/L (ref 98–111)
Creatinine, Ser: 1.34 mg/dL — ABNORMAL HIGH (ref 0.61–1.24)
GFR, Estimated: 59 mL/min — ABNORMAL LOW (ref >60)
Glucose, Bld: 100 mg/dL — ABNORMAL HIGH (ref 70–99)
Potassium: 4.7 mmol/L (ref 3.5–5.1)
Sodium: 139 mmol/L (ref 135–145)
Total Bilirubin: 0.6 mg/dL (ref 0.3–1.2)
Total Protein: 5.3 g/dL — ABNORMAL LOW (ref 6.5–8.1)

## 2022-07-21 LAB — EXPECTORATED SPUTUM ASSESSMENT W GRAM STAIN, RFLX TO RESP C

## 2022-07-21 LAB — MAGNESIUM: Magnesium: 2.8 mg/dL — ABNORMAL HIGH (ref 1.7–2.4)

## 2022-07-21 LAB — PHOSPHORUS: Phosphorus: 4.9 mg/dL — ABNORMAL HIGH (ref 2.5–4.6)

## 2022-07-21 MED ORDER — APIXABAN 5 MG PO TABS
5.0000 mg | ORAL_TABLET | Freq: Two times a day (BID) | ORAL | Status: DC
  Administered 2022-07-21: 5 mg via ORAL
  Filled 2022-07-21: qty 1

## 2022-07-21 MED ORDER — APIXABAN 5 MG PO TABS
5.0000 mg | ORAL_TABLET | Freq: Two times a day (BID) | ORAL | 0 refills | Status: AC
  Filled 2022-07-21: qty 60, 30d supply, fill #0

## 2022-07-21 MED ORDER — INSULIN ASPART 100 UNIT/ML IJ SOLN
15.0000 [IU] | Freq: Three times a day (TID) | INTRAMUSCULAR | Status: DC
  Administered 2022-07-21: 15 [IU] via SUBCUTANEOUS

## 2022-07-21 MED ORDER — AMOXICILLIN-POT CLAVULANATE 875-125 MG PO TABS
1.0000 | ORAL_TABLET | Freq: Two times a day (BID) | ORAL | 0 refills | Status: AC
  Filled 2022-07-21: qty 42, 21d supply, fill #0

## 2022-07-21 NOTE — TOC Transition Note (Addendum)
Transition of Care (TOC) - CM/SW Discharge Note Marvetta Gibbons RN,BSN Transitions of Care Unit 4NP (Non Trauma)- RN Case Manager See Treatment Team for direct Phone #   Patient Details  Name: Hunter Lewis MRN: [id number] Date of Birth: 04/13/1956  Transition of Care [facility]) CM/SW Contact:  Dawayne Patricia, RN Phone Number: 07/21/2022, 3:12 PM   Clinical Narrative:    Pt stable for transition home today, wife to transport home.  Pt will need 5L of 02, PTA pt used 3L at night.   New home 02 order has been placed.  CM called pt at bedside to see what DME provider pt uses at home- per pt he buys his 02 online- [employer]. He voices that he would prefer to be set up with a local provider as well. Choice offered for a local DME provider and pt states he would like to use [employer] [facility]. Pt agreeable to wait for home 02 to be processed by [employer] and portable to be delivered to room for transport home.   Call made to [facility] w/ [employer] for DME referral - home 02 to be processed and portable will be delivered to room for transport home.   Medications to be filled by [facility] pharmacy and delivered to bedside  No further TOC needs noted   Final next level of care: Home/Self Care Barriers to Discharge: No Barriers Identified   Patient Goals and CMS Choice CMS Medicare.gov Compare Post Acute Care list provided to:: Patient Choice offered to / list presented to : Patient  Discharge Placement               Home           Discharge Plan and Services Additional resources added to the After Visit Summary for     Discharge Planning Services: CM Consult Post Acute Care Choice: Durable Medical Equipment          DME Arranged: Oxygen DME Agency: [employer] Date DME Agency Contacted: 07/21/22 Time DME Agency Contacted: [id number] Representative spoke with at DME Agency: Erasmo Downer HH Arranged: NA [place] Agency: NA        Social Determinants of Health ([place]) Interventions SDOH Screenings    Tobacco Use: Low Risk  (07/18/2022)     Readmission Risk Interventions    07/21/2022    3:12 PM  Readmission Risk Prevention Plan  Transportation Screening Complete  PCP or Specialist Appt within 5-7 Days Complete  Home Care Screening Complete  Medication Review (RN CM) Complete

## 2022-07-21 NOTE — Progress Notes (Signed)
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 Asthma since his childhood and has been treated with bronchodilators inhaled steroids and oxygen over the last year. Followed by Dr. Dan Europe with [facility], however after he left the group patient has not followed up in over a year.   Presents to ED 2/1 with progressive shortness of breath, orthopnea, dyspnea over last 3-4 days. COVID/RSV/Flu negative. CT chest with very extensive heterogeneous and consolidative airspace opacity throughout the lungs, more conspicuous and dense in the left lung although also seen in the right lung base.  Masslike, cavitating appearance of the left lower lobe, this region measuring approximately 9.3 x 6.9 cm. Patient denies aspiration symptoms. Pulmonary Consulted 2/2.    Pertinent  Medical History   Past Medical History:  Diagnosis Date   Arthritis    hands    Asthma    Chronic diastolic heart failure ([place])    Complication of anesthesia    slow to awaken   Diabetes mellitus without complication ([place])    Type II   Dyspnea    with asthma- last issue  was early September   Hypertension    Neuropathy    feet   Pneumonia 2017   2017- hospitalized [place], 2018  did not require hospitalization    Significant Hospital Events: Including procedures, antibiotic start and stop dates in addition to other pertinent events   2/1 > Presents to ED  2/2 > Pulmonary Consulted  2/3 > New onset A.Fib RVR.  Given cardizem and started on heparin gtt. Oxygen up to 6L [place].   Interim History / Subjective:  No events  overnight. Currently on 4 L at rest and 6 L with activity. Sats 93%. He wants to go home today. Primary team are fine with him being discharged today. From a Pulmonary perspective, Dr. Valeta Harms saw the patient 2/4 and feels this is a reasonable request.   Objective   Blood pressure 133/68, pulse 81, temperature 98.2 F (36.8 C), temperature source Oral, resp. rate 20, height 6' (1.829 m), weight 109.4 kg, SpO2 93 %.        Intake/Output Summary (Last 24 hours) at 07/21/2022 1023 Last data filed at 07/20/2022 2323 Gross per 24 hour  Intake 577.46 ml  Output 1700 ml  Net -1122.54 ml   Filed Weights   07/18/22 0500 07/19/22 0500 07/21/22 0500  Weight: 109 kg 109.4 kg 109.4 kg    Examination: General: Adult male, lying in bed, no distress , sats of 90%, wants to go home HENT: Dry MM , No JVD or LAD, Lungs: Bilateral chest excursion, Rhonchi throughout, Diminished breath sounds to left, right coarse, mild  accessory muscle use, no wheeze, no crackles  Cardiovascular: Regular, HR 81, no mRG  Abdomen: Obese, soft, distended, active bowel sounds , Body mass index is 32.71 kg/m.  Extremities: Bilateral lower extremity venous stasis, -edema  Neuro: Alert, oriented, follows commands , anxious to go home GU: intact   Net Negative 3.2 L   Labs Reviewed 2/5 WBC 11.2 ( 15.6)/ HGB 14.9/Platelets 148K ( From 205 K ) Phos 4.9/ Mag 2.8  Na 139/ K 4.7/Cl 103/CO2 28/BUN 61/ Creatinine 1.34 ( 1.67)/ ALT 46 ( 74)/ GFR 59 Resolved Hospital Problem list     Assessment & Plan:   Acute on chronic hypoxic respiratory failure in the setting of cavitary pneumonia, complicated by poorly controlled Asthma and Congestive heart failure  - MRSA PCR negative  - Strep P negative - RVP + Coronavirus  Plan  - Continue Zosyn/Azithromycin >> Legionella still pending 2/5, if negative would d/c azithromycin. Culture pending >> Will need follow antibiotics transitioned to PO if discharged home  - Repeat CXR in am if  patient remains as inpatient - Titrate supplemental O2 therapy to keep sats greater than 88% (Currently on 6L [place])  - Speech Following >> swallow negative for aspiration - Continue scheduled Bronchodilators>> will need to do this at home also>> already has neb machine at home - Agressive Pulmonary hygiene >> IS/Flutter/Metaneb - IS Q 1 while awake >> must continue at home  - Flutter valve 4 blows , four times daily, not at bedside, will need to use once home for secretion mobilization  - Will need to seek emergency care for any hemoptysis that does not resolve upon discharge - Consider sleep study in the future as OP  - Will need OP follow up with Pulmonary medicine for within a week of discharge for  pulmonary issues and to follow pneumonia with imaging until clear.  - Will need continued treatment of atrial fibrillation vis cardiology or PCP. . - Will need OP follow up with PCP within a week of discharge  with BMET and LFT's to monitor renal function, elevated liver function tests and thrombocytopenia   A.Fib RVR Plan - Cardiac Monitoring - Primary team consulted Cardiology  - Heparin gtt  - Continue Metoprolol   Congestive heart failure - ECHO 2/3 > EF 40-45, left ventricle with global hypokinesis, IVC normal  Plan - Holding diuretics >> patient appears euvolemic, kidney function now improving with holding   Acute Renal Injury  Hyponatremia  Plan - Trend BMP  - Avoid nephrotoxic agents   Acid Reflux  Plan - Continue PPI   Diabetes mellitus - Hemoglobin AIC 7.6  Plan  - SSI  - Semglee 35 units daily   Best Practice (right click and "Reselect all SmartList Selections" daily)   Diet/type: Regular consistency (see orders) Code Status:  full code Last date of multidisciplinary goals of care discussion [tbd]   Patient is anxious to go home. He desaturated with ambulation . He is on 6 L now. I will defer to Dr. Valeta Harms to make the decision. Pt, wife can work from home so he  will not be alone. See recommendations in Acute on chronic hypoxic respiratory failure in the setting of cavitary pneumonia, section above . I have provided wife with strict return to hospital criteria . They have oxygen at home and they have a nebulizer machine at home. Recommendations from a Pulmonary perspective:   - Continue scheduled Bronchodilators>> will need to do this at home also>> already has neb machine at home - Agressive Pulmonary hygiene >> IS/Flutter/Metaneb - IS Q 1 while awake >> must continue at home >> use when commercials on TV come on.  - Flutter valve 4 blows , four times daily, not at bedside, will need to use once home for secretion mobilization  - Will need to seek emergency care for any hemoptysis that does not resolve upon discharge - Consider sleep study in the future as OP >> Sleep consult -  Antibiotics to be transitioned to PO dosing per primary team - Will need OP follow up with [place] [facility] medicine within a week of discharge for  pulmonary management  and to follow pneumonia with imaging until clear.  - Will need continued treatment of atrial fibrillation via cardiology or PCP. . - Will need OP follow up with PCP within a week of discharge  with BMET and LFT's to monitor renal function, elevated liver function tests and thrombocytopenia    Labs   CBC: Recent Labs  Lab 07/17/22 1140 07/18/22 0339 07/19/22 0304 07/20/22 0029 07/21/22 0344  WBC 16.4* 13.8* 21.8* 15.6* 11.2*  NEUTROABS 14.9*  --   --   --   --   HGB 13.3 13.5 15.0 15.6 14.9  HCT 42.3 42.0 46.7 49.5 48.9  MCV 84.8 85.0 84.1 85.5 87.3  PLT 180 178 244 205 148*    Basic Metabolic Panel: Recent Labs  Lab 07/17/22 1140 07/18/22 0339 07/19/22 0304 07/20/22 0029 07/21/22 0344  NA 131* 132* 130* 134* 139  K 4.2 4.0 4.3 4.1 4.7  CL 94* 95* 94* 100 103  CO2 24 25 21* 25 28  GLUCOSE 191* 284* 208* 73 100*  BUN 59* 78* 96* 94* 61*  CREATININE 2.31* 2.35* 1.96* 1.67* 1.34*   CALCIUM 7.8* 7.7* 8.0* 8.1* 8.4*  MG  --   --  2.9* 2.9* 2.8*  PHOS  --   --   --  5.5* 4.9*   GFR: Estimated Creatinine Clearance: 71.1 mL/min (A) (by C-G formula based on SCr of 1.34 mg/dL (H)). Recent Labs  Lab 07/17/22 1140 07/17/22 1614 07/18/22 0339 07/19/22 0304 07/20/22 0029 07/21/22 0344  PROCALCITON  --  7.09 7.64 4.95  --   --   WBC 16.4*  --  13.8* 21.8* 15.6* 11.2*  LATICACIDVEN 1.7  --   --   --   --   --     Liver Function Tests: Recent Labs  Lab 07/17/22 1140 07/17/22 1614 07/18/22 0339 07/19/22 0304 07/21/22 0344  AST 24 29 71* 31 15  ALT 25 29 63* 74* 46*  ALKPHOS 82 63 66 88 70  BILITOT 1.9* 1.3* 1.0 0.8 0.6  PROT 6.3* 5.8* 5.5* 5.6* 5.3*  ALBUMIN 2.7* 2.4* 2.3* 2.3* 2.3*   No results for input(s): "LIPASE", "AMYLASE" in the last 168 hours. No results for input(s): "AMMONIA" in the last 168 hours.  ABG No results found for: "PHART", "PCO2ART", "PO2ART", "HCO3", "TCO2", "ACIDBASEDEF", "O2SAT"   Coagulation Profile: Recent Labs  Lab 07/17/22 1140 07/18/22 0339  INR 1.6* 1.6*    Cardiac Enzymes: No results for input(s): "CKTOTAL", "CKMB", "CKMBINDEX", "TROPONINI" in the last 168 hours.  HbA1C: Hgb A1c MFr Bld  Date/Time Value Ref Range Status  07/17/2022 06:18 PM 7.6 (H) 4.8 - 5.6 % Final    Comment:    (NOTE) Pre diabetes:          5.7%-6.4%  Diabetes:              >6.4%  Glycemic control for   <7.0% adults with diabetes   03/13/2017 08:54 AM 9.1 (H) 4.8 - 5.6 % Final    Comment:    (NOTE) Pre diabetes:          5.7%-6.4% Diabetes:              >6.4% Glycemic control for   <7.0% adults with diabetes     CBG: Recent Labs  Lab 07/20/22 1124 07/20/22 1616 07/20/22  1634 07/20/22 2117 07/21/22 0910  GLUCAP 268* 62* 81 96 151*   Allergies No Known Allergies   Home Medications  Prior to Admission medications   Medication Sig Start Date End Date Taking? Authorizing Provider  amLODipine (NORVASC) 10 MG tablet Take 10  mg by mouth daily.   Yes [provider]  aspirin EC 81 MG tablet Take 81 mg by mouth daily.   Yes [provider]  empagliflozin (JARDIANCE) 25 MG TABS tablet Take 25 mg by mouth daily.   Yes [provider]  furosemide (LASIX) 40 MG tablet Take 40 mg by mouth daily.   Yes [provider]  Insulin Glargine (BASAGLAR KWIKPEN) 100 UNIT/ML SOPN Inject 60 Units into the skin at bedtime.   Yes [provider]  Insulin Lispro (HUMALOG KWIKPEN) 200 UNIT/ML SOPN Inject 45 Units into the skin 3 (three) times daily with meals.   Yes [provider]  metoprolol succinate (TOPROL-XL) 100 MG 24 hr tablet Take 100 mg by mouth daily. Take with or immediately following a meal.   Yes [provider]  rosuvastatin (CRESTOR) 10 MG tablet Take 10 mg by mouth daily.   Yes [provider]  tamsulosin (FLOMAX) 0.4 MG CAPS capsule Take 0.4 mg by mouth daily.   Yes [provider]     Time Spent: 45  minutes     Bevelyn Ngo, MSN, AGACNP-BC [place] [facility] See Amion for personal pager PCCM on call pager [phone]   Pulmonary & Critical Care  PCCM Pgr: [phone] 07/21/2022 11:31 AM

## 2022-07-21 NOTE — Discharge Summary (Addendum)
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 cleared by the [place] FDA and has been authorized for detection and/or diagnosis of SARS-CoV-2 by FDA under an Emergency Use Authorization (EUA). This EUA will remain in effect (meaning this test can be used) for the duration of the COVID-19 declaration under Section 564(b)(1) of the Act, 21 U.S.C. section 360bbb-3(b)(1), unless the authorization is terminated or revoked.  Performed at [facility], [place] [address], [place], [place] [address]   Blood Culture (routine x 2)     Status: None (Preliminary result)   Collection Time: 07/17/22 11:40 AM   Specimen: BLOOD  Result Value Ref Range Status   Specimen Description BLOOD RIGHT ANTECUBITAL  Final   Special Requests   Final    BOTTLES DRAWN AEROBIC AND ANAEROBIC Blood Culture adequate volume   Culture   Final    NO GROWTH 4 DAYS Performed at [facility], [place] [address], [place], [place] [address]    Report Status PENDING  Incomplete  Urine Culture     Status: Abnormal   Collection Time: 07/17/22 11:40 AM   Specimen: Urine, Clean Catch  Result Value Ref Range Status   Specimen Description URINE, CLEAN CATCH   Final   Special Requests NONE Reflexed from [id number]  Final   Culture (A)  Final    60,000 COLONIES/mL YEAST Performed at [facility], [place] [address], [place], [place] [address]    Report Status 07/18/2022 FINAL  Final  Blood Culture (routine x 2)     Status: None (Preliminary result)   Collection Time: 07/17/22 11:58 AM   Specimen: BLOOD LEFT HAND  Result Value Ref Range Status   Specimen Description BLOOD LEFT HAND  Final   Special Requests   Final    BOTTLES DRAWN AEROBIC AND ANAEROBIC Blood Culture adequate volume   Culture   Final    NO GROWTH 4 DAYS Performed at [facility], [place] [address], [place], [place] [address]    Report Status PENDING  Incomplete  MRSA Next Gen by PCR, Nasal     Status: None   Collection Time: 07/17/22  3:28 PM   Specimen: Nasal Mucosa; Nasal Swab  Result Value Ref Range Status   MRSA by PCR Next Gen NOT DETECTED NOT DETECTED Final    Comment: (NOTE) The GeneXpert MRSA Assay (FDA approved for NASAL specimens only), is one component of a comprehensive MRSA colonization surveillance program. It is not intended to diagnose MRSA infection nor to guide or monitor treatment for MRSA infections. Test performance is not FDA approved in patients less than 16 years old. Performed at [facility], [place] [address] [place],  [address]  Expectorated Sputum Assessment w Gram Stain, Rflx to Resp Cult     Status: None (Preliminary result)   Collection Time: 07/19/22  7:49 AM   Specimen: Expectorated Sputum  Result Value Ref Range Status   Specimen Description EXPECTORATED SPUTUM  Final   Special Requests NONE  Final   Sputum evaluation   Final    Sputum specimen not acceptable for testing.  Please recollect.    C/ Webb Laws, RN 07/19/22 1205 A. LAFRANCE Performed at [facility], [address] [place], [place] [address]    Report Status PENDING  Incomplete  Respiratory (~20 pathogens) panel by PCR     Status: Abnormal    Collection Time: 07/19/22 11:28 AM   Specimen: Nasopharyngeal Swab; Respiratory  Result Value Ref Range Status   Adenovirus NOT DETECTED NOT DETECTED Final   Coronavirus 229E NOT DETECTED NOT DETECTED Final    Comment: (NOTE) The Coronavirus on the Respiratory Panel, DOES NOT test for the novel  Coronavirus (2019 nCoV)    Coronavirus HKU1 NOT DETECTED NOT DETECTED Final   Coronavirus NL63 NOT DETECTED NOT DETECTED Final   Coronavirus OC43 DETECTED (A) NOT DETECTED Final   Metapneumovirus NOT DETECTED NOT DETECTED Final   Rhinovirus / Enterovirus NOT DETECTED NOT DETECTED Final   Influenza A NOT DETECTED NOT DETECTED Final   Influenza B NOT DETECTED NOT DETECTED Final   Parainfluenza Virus 1 NOT DETECTED NOT DETECTED Final   Parainfluenza Virus 2 NOT DETECTED NOT DETECTED Final   Parainfluenza Virus 3 NOT DETECTED NOT DETECTED Final   Parainfluenza Virus 4 NOT DETECTED NOT DETECTED Final   Respiratory Syncytial Virus NOT DETECTED NOT DETECTED Final   Bordetella pertussis NOT DETECTED NOT DETECTED Final   Bordetella Parapertussis NOT DETECTED NOT DETECTED Final   Chlamydophila pneumoniae NOT DETECTED NOT DETECTED Final   Mycoplasma pneumoniae NOT DETECTED NOT DETECTED Final    Comment: Performed at [facility], [address] [place], [place] [address]  Expectorated Sputum Assessment w Gram Stain, Rflx to Resp Cult     Status: None   Collection Time: 07/19/22  3:16 PM   Specimen: Expectorated Sputum  Result Value Ref Range Status   Specimen Description EXPECTORATED SPUTUM  Final   Special Requests NONE  Final   Sputum evaluation   Final    THIS SPECIMEN IS ACCEPTABLE FOR SPUTUM CULTURE Performed at [facility], [address] [place], [place] [address]    Report Status 07/20/2022 FINAL  Final  Culture, Respiratory w Gram Stain     Status: None (Preliminary result)   Collection Time: 07/19/22  3:16 PM  Result Value Ref Range Status   Specimen Description  EXPECTORATED SPUTUM  Final   Special Requests NONE Reflexed from [id number]  Final   Gram Stain   Final    MODERATE WBC PRESENT, PREDOMINANTLY MONONUCLEAR FEW GRAM NEGATIVE RODS RARE YEAST    Culture   Final    CULTURE REINCUBATED FOR BETTER GROWTH Performed at [facility], [address], [place], [place] [address]    Report Status PENDING  Incomplete     Labs: Basic Metabolic Panel: Recent Labs  Lab 07/17/22 1140 07/18/22 0339 07/19/22 0304 07/20/22 0029 07/21/22 0344  NA 131* 132* 130* 134* 139  K 4.2 4.0 4.3 4.1 4.7  CL 94* 95* 94* 100 103  CO2 24 25 21* 25 28  GLUCOSE 191* 284* 208* 73 100*  BUN 59* 78* 96* 94* 61*  CREATININE 2.31* 2.35* 1.96* 1.67* 1.34*  CALCIUM 7.8* 7.7* 8.0* 8.1* 8.4*  MG  --   --  2.9* 2.9* 2.8*  PHOS  --   --   --  5.5* 4.9*   Liver Function Tests: Recent Labs  Lab 07/17/22 1140 07/17/22 1614 07/18/22 0339 07/19/22 0304 07/21/22 0344  AST 24 29 71* 31 15  ALT 25 29 63* 74* 46*  ALKPHOS 82 63 66 88 70  BILITOT 1.9* 1.3* 1.0 0.8 0.6  PROT 6.3* 5.8* 5.5* 5.6* 5.3*  ALBUMIN 2.7* 2.4* 2.3* 2.3* 2.3*   CBC: Recent Labs  Lab 07/17/22 1140 07/18/22 0339 07/19/22 0304 07/20/22 0029 07/21/22 0344  WBC 16.4* 13.8* 21.8* 15.6* 11.2*  NEUTROABS 14.9*  --   --   --   --   HGB 13.3 13.5 15.0 15.6 14.9  HCT 42.3 42.0 46.7 49.5 48.9  MCV 84.8 85.0 84.1 85.5 87.3  PLT 180 178 244 205 148*   CBG: Recent Labs  Lab 07/20/22 1616 07/20/22 1634 07/20/22 2117 07/21/22 0910 07/21/22 1118  GLUCAP 62* 81 96 151* 143*   Hgb A1c No results for input(s): "HGBA1C" in the last 72 hours. Lipid Profile No results for input(s): "CHOL", "HDL", "LDLCALC", "TRIG", "CHOLHDL", "LDLDIRECT" in the last 72 hours. Thyroid function studies No results for input(s): "TSH", "T4TOTAL", "T3FREE", "THYROIDAB" in the last 72 hours.  Invalid input(s): "FREET3" Urinalysis    Component Value Date/Time   COLORURINE YELLOW 07/17/2022 1140   APPEARANCEUR  HAZY (A) 07/17/2022 1140   LABSPEC 1.013 07/17/2022 1140   PHURINE 5.0 07/17/2022 1140   GLUCOSEU >=500 (A) 07/17/2022 1140   HGBUR SMALL (A) 07/17/2022 1140   BILIRUBINUR NEGATIVE 07/17/2022 1140   KETONESUR NEGATIVE 07/17/2022 1140   PROTEINUR NEGATIVE 07/17/2022 1140   NITRITE NEGATIVE 07/17/2022 1140   LEUKOCYTESUR MODERATE (A) 07/17/2022 1140    FURTHER DISCHARGE INSTRUCTIONS:   Get Medicines reviewed and adjusted: Please take all your medications with you for your next visit with your Primary MD   Laboratory/radiological data: Please request your Primary MD to go over all hospital tests and procedure/radiological results at the follow up, please ask your Primary MD to get all Hospital records sent to his/her office.   In some cases, they will be blood work, cultures and biopsy results pending at the time of your discharge. Please request that your primary care M.D. goes through all the records of your hospital data and follows up on these results.   Also Note the following: If you experience worsening of your admission symptoms, develop shortness of breath, life threatening emergency, suicidal or homicidal thoughts you must seek medical attention immediately by calling 911 or calling your MD immediately  if symptoms less severe.   You must read complete instructions/literature along with all the possible adverse reactions/side effects for all the Medicines you take and that have been prescribed to you. Take any new Medicines after you have completely understood and accpet all the possible adverse reactions/side effects.    Do not drive when taking Pain medications or sleeping medications (Benzodaizepines)   Do not take more than prescribed Pain, Sleep and Anxiety Medications. It is not advisable to combine anxiety,sleep and pain medications without talking with your primary care practitioner   Special Instructions: If you have smoked or chewed Tobacco  in the last 2 yrs please  stop smoking, stop any regular Alcohol  and or any Recreational drug use.   Wear Seat belts while driving.   Please  note: You were cared for by a hospitalist during your hospital stay. Once you are discharged, your primary care physician will handle any further medical issues. Please note that NO REFILLS for any discharge medications will be authorized once you are discharged, as it is imperative that you return to your primary care physician (or establish a relationship with a primary care physician if you do not have one) for your post hospital discharge needs so that they can reassess your need for medications and monitor your lab values.  Time coordinating discharge: 45 minutes  SIGNED:  Pamella Pert, MD, PhD 07/21/2022, 2:36 PM

## 2022-07-21 NOTE — Progress Notes (Signed)
Modified Barium Swallow Progress Note  Patient Details  Name: Hunter Lewis MRN: [id number] Date of Birth: Apr 07, 1956  Today's Date: 07/21/2022  Modified Barium Swallow completed.  Full report located under Chart Review in the Imaging Section.  Brief recommendations include the following:  Clinical Impression  Pt's oropharyngeal swallow is within gross functional limits, allowing for slight variances in mastication given missing dentition. He has trace-mild residue that collects in the valleculae with thin liquids that is not observed as consistencies become thicker. The barium tablet appeared to clear the esophagus swiftly.  No aspiration, or even penetration, were observed. Recommend that he continue with regular solids and thin liquids. Education was provided about aspiration precautions particularly in the setting of reduced respiratory state. SLP to sign off.   Swallow Evaluation Recommendations       SLP Diet Recommendations: Regular solids;Thin liquid   Liquid Administration via: Cup;Straw   Medication Administration: Whole meds with liquid   Supervision: Patient able to self feed   Compensations: Slow rate;Small sips/bites (rest breaks if dyspneic)   Postural Changes: Seated upright at 90 degrees   Oral Care Recommendations: Oral care BID        Osie Bond., M.A. [place] Office [phone]  Secure chat preferred  07/21/2022,9:13 AM

## 2022-07-21 NOTE — Discharge Instructions (Signed)
Follow with Pa, [place] in 5-7 days  Please get a complete blood count and chemistry panel checked by your Primary MD at your next visit, and again as instructed by your Primary MD. Please get your medications reviewed and adjusted by your Primary MD.  Please request your Primary MD to go over all Hospital Tests and Procedure/Radiological results at the follow up, please get all Hospital records sent to your Prim MD by signing hospital release before you go home.  In some cases, there will be blood work, cultures and biopsy results pending at the time of your discharge. Please request that your primary care M.D. goes through all the records of your hospital data and follows up on these results.  If you had Pneumonia of Lung problems at the Hospital: Please get a 2 view Chest X ray done in 6-8 weeks after hospital discharge or sooner if instructed by your Primary MD.  If you have Congestive Heart Failure: Please call your Cardiologist or Primary MD anytime you have any of the following symptoms:  1) 3 pound weight gain in 24 hours or 5 pounds in 1 week  2) shortness of breath, with or without a dry hacking cough  3) swelling in the hands, feet or stomach  4) if you have to sleep on extra pillows at night in order to breathe  Follow cardiac low salt diet and 1.5 lit/day fluid restriction.  If you have diabetes Accuchecks 4 times/day, Once in AM empty stomach and then before each meal. Log in all results and show them to your primary doctor at your next visit. If any glucose reading is under 80 or above 300 call your primary MD immediately.  If you have Seizure/Convulsions/Epilepsy: Please do not drive, operate heavy machinery, participate in activities at heights or participate in high speed sports until you have seen by Primary MD or a Neurologist and advised to do so again. Per [facility] statutes, patients with seizures are not allowed to drive until they  have been seizure-free for six months.  Use caution when using heavy equipment or power tools. Avoid working on ladders or at heights. Take showers instead of baths. Ensure the water temperature is not too high on the home water heater. Do not go swimming alone. Do not lock yourself in a room alone (i.e. bathroom). When caring for infants or small children, sit down when holding, feeding, or changing them to minimize risk of injury to the child in the event you have a seizure. Maintain good sleep hygiene. Avoid alcohol.   If you had Gastrointestinal Bleeding: Please ask your Primary MD to check a complete blood count within one week of discharge or at your next visit. Your endoscopic/colonoscopic biopsies that are pending at the time of discharge, will also need to followed by your Primary MD.  Get Medicines reviewed and adjusted. Please take all your medications with you for your next visit with your Primary MD  Please request your Primary MD to go over all hospital tests and procedure/radiological results at the follow up, please ask your Primary MD to get all Hospital records sent to his/her office.  If you experience worsening of your admission symptoms, develop shortness of breath, life threatening emergency, suicidal or homicidal thoughts you must seek medical attention immediately by calling 911 or calling your MD immediately  if symptoms less severe.  You must read complete instructions/literature along with all the possible adverse reactions/side effects for all the Medicines  you take and that have been prescribed to you. Take any new Medicines after you have completely understood and accpet all the possible adverse reactions/side effects.   Do not drive or operate heavy machinery when taking Pain medications.   Do not take more than prescribed Pain, Sleep and Anxiety Medications  Special Instructions: If you have smoked or chewed Tobacco  in the last 2 yrs please stop smoking, stop any  regular Alcohol  and or any Recreational drug use.  Wear Seat belts while driving.  Please note You were cared for by a hospitalist during your hospital stay. If you have any questions about your discharge medications or the care you received while you were in the hospital after you are discharged, you can call the unit and asked to speak with the hospitalist on call if the hospitalist that took care of you is not available. Once you are discharged, your primary care physician will handle any further medical issues. Please note that NO REFILLS for any discharge medications will be authorized once you are discharged, as it is imperative that you return to your primary care physician (or establish a relationship with a primary care physician if you do not have one) for your aftercare needs so that they can reassess your need for medications and monitor your lab values.  You can reach the hospitalist office at phone [phone] or fax [phone]   If you do not have a primary care physician, you can call [phone] for a physician referral.  Activity: As tolerated with Full fall precautions use walker/cane & assistance as needed    Diet: heart healthy, diabetic  Disposition Home

## 2022-07-21 NOTE — Inpatient Diabetes Management (Signed)
Inpatient Diabetes Program Recommendations  AACE/ADA: New Consensus Statement on Inpatient Glycemic Control (2015)  Target Ranges:  Prepandial:   less than 140 mg/dL      Peak postprandial:   less than 180 mg/dL (1-2 hours)      Critically ill patients:  140 - 180 mg/dL   Lab Results  Component Value Date   GLUCAP 151 (H) 07/21/2022   HGBA1C 7.6 (H) 07/17/2022    Review of Glycemic Control  Latest Reference Range & Units 07/20/22 07:29 07/20/22 11:24 07/20/22 16:16 07/20/22 16:34 07/20/22 21:17 07/21/22 09:10  Glucose-Capillary 70 - 99 mg/dL 112 (H) 268 (H) 62 (L) 81 96 151 (H)  (H): Data is abnormally high (L): Data is abnormally low Diabetes history: Type 2 DM Outpatient Diabetes medications: Basaglar 60 units QHS, Humalog 45 units TID, Jardiance 25 mg QD Current orders for Inpatient glycemic control: Jardiance 10 mg QD, Novolog 20 units TID, Novolog 0-15 units TID & HS, Semglee 35 units QHS Solumedrol 125 mg x 1  Inpatient Diabetes Program Recommendations:    Noted mild hypoglycemia yesterday following correction and meal coverage doses. Appears that doses were given within 2.5 hours of each other totaling 48 units of short acting insulin.  Doses need to be spaced by atleast 4 hours to reduce risk of hypoglycemia.   Would consider reducing Novolog 16 units TID (assuming patient consuming >50% of meals)  Thanks, Bronson Curb, MSN, RNC-OB Diabetes Coordinator [phone] (8a-5p)

## 2022-07-21 NOTE — Evaluation (Signed)
Physical Therapy Evaluation Patient Details Name: Hunter Lewis MRN: [id number] DOB: 02-26-1956 Today's Date: 07/21/2022  History of Present Illness  Hunter Lewis is a 67 y.o. male who presented to [facility] with shortness of breath, found to have necrotizing pneumonia. PMH: asthma, hypertension, diabetes, diastolic CHF, OSA   Clinical Impression  Pt admitted with above. Pt functioning near baseline. See pt's SpO2 while ambulating. Pt with good home set up and support. Pt functioning at supervision level. Acute PT to follow while in hospital.  SATURATION QUALIFICATIONS: (This note is used to comply with regulatory documentation for home oxygen)  Patient Saturations on Room Air at Rest = 86%  Patient Saturations on Room Air while Ambulating = 86%  Patient Saturations on 4 Liters of oxygen while Ambulating = 89%, 6LO2, SpO2 91%  Please briefly explain why patient needs home oxygen:  pt unable to maintain SpO2 >89% on RA     Recommendations for follow up therapy are one component of a multi-disciplinary discharge planning process, led by the attending physician.  Recommendations may be updated based on patient status, additional functional criteria and insurance authorization.  Follow Up Recommendations No PT follow up      Assistance Recommended at Discharge Intermittent Supervision/Assistance  Patient can return home with the following       Equipment Recommendations None recommended by PT  Recommendations for Other Services       Functional Status Assessment Patient has had a recent decline in their functional status and demonstrates the ability to make significant improvements in function in a reasonable and predictable amount of time.     Precautions / Restrictions Precautions Precautions: Other (comment) Precaution Comments: watch SpO2 Restrictions Weight Bearing Restrictions: No      Mobility  Bed Mobility Overal bed mobility: Modified Independent              General bed mobility comments: HOB elevated, no difficulty    Transfers Overall transfer level: Modified independent Equipment used: None               General transfer comment: no difficulty    Ambulation/Gait Ambulation/Gait assistance: Min guard Gait Distance (Feet): 300 Feet Assistive device: None Gait Pattern/deviations: Step-through pattern Gait velocity: dec compared to baseline Gait velocity interpretation: 1.31 - 2.62 ft/sec, indicative of limited community ambulator   General Gait Details: noted SOB, SpO2 >89% on 4LO2 via [place], no episode of LOB  Stairs            Wheelchair Mobility    Modified Rankin (Stroke Patients Only)       Balance Overall balance assessment: No apparent balance deficits (not formally assessed)                                           Pertinent Vitals/Pain Pain Assessment Pain Assessment: No/denies pain    Home Living Family/patient expects to be discharged to:: Private residence Living Arrangements: Spouse/significant other Available Help at Discharge: Family;Available PRN/intermittently Type of Home: House Home Access: Stairs to enter Entrance Stairs-Rails: Can reach both Entrance Stairs-Number of Steps: 3   Home Layout: One level Home Equipment: None      Prior Function Prior Level of Function : Independent/Modified Independent;Working/employed;Driving             Mobility Comments: indep ADLs Comments: indep     Hand Dominance   Dominant Hand: Right  Extremity/Trunk Assessment   Upper Extremity Assessment Upper Extremity Assessment: Overall WFL for tasks assessed    Lower Extremity Assessment Lower Extremity Assessment: Overall WFL for tasks assessed    Cervical / Trunk Assessment Cervical / Trunk Assessment: Normal  Communication   Communication: No difficulties  Cognition                                                General Comments General  comments (skin integrity, edema, etc.): VSS    Exercises     Assessment/Plan    PT Assessment Patient needs continued PT services  PT Problem List Decreased strength;Decreased balance;Decreased activity tolerance;Decreased mobility       PT Treatment Interventions Gait training;Stair training;Functional mobility training;Therapeutic activities;Therapeutic exercise;Balance training;Neuromuscular re-education    PT Goals (Current goals can be found in the Care Plan section)  Acute Rehab PT Goals Patient Stated Goal: home asap PT Goal Formulation: With patient Time For Goal Achievement: 08/04/22 Potential to Achieve Goals: Good    Frequency Min 3X/week     Co-evaluation               AM-PAC PT "6 Clicks" Mobility  Outcome Measure Help needed turning from your back to your side while in a flat bed without using bedrails?: None Help needed moving from lying on your back to sitting on the side of a flat bed without using bedrails?: None Help needed moving to and from a bed to a chair (including a wheelchair)?: None Help needed standing up from a chair using your arms (e.g., wheelchair or bedside chair)?: None Help needed to walk in hospital room?: A Little Help needed climbing 3-5 steps with a railing? : A Little 6 Click Score: 22    End of Session Equipment Utilized During Treatment: Gait belt Activity Tolerance: Patient tolerated treatment well Patient left: in bed;with call bell/phone within reach;with family/visitor present (sitting EOB) Nurse Communication: Mobility status (SpO2) PT Visit Diagnosis: Muscle weakness (generalized) (M62.81)    Time: 1962-2297 PT Time Calculation (min) (ACUTE ONLY): 25 min   Charges:   PT Evaluation $PT Eval Moderate Complexity: 1 Mod PT Treatments $Gait Training: 8-22 mins        Kittie Plater, PT, DPT Acute Rehabilitation Services Secure chat preferred Office #: [phone]   Berline Lopes 07/21/2022, 12:53 PM

## 2022-07-21 NOTE — Progress Notes (Signed)
ANTICOAGULATION CONSULT NOTE - Follow up  Pharmacy Consult for heparin => apixaban Indication: atrial fibrillation  No Known Allergies  Patient Measurements: Height: 6' (182.9 cm) Weight: 109.4 kg (241 lb 2.9 oz) IBW/kg (Calculated) : 77.6 Heparin Dosing Weight: 100.8 kg  Vital Signs: Temp: 98.2 F (36.8 C) (02/05 0756) Temp Source: Oral (02/05 0756) BP: 133/68 (02/05 0756) Pulse Rate: 81 (02/05 0756)  Labs: Recent Labs    07/19/22 0304 07/19/22 1148 07/19/22 1656 07/20/22 0029 07/20/22 1001 07/20/22 1811 07/21/22 0344  HGB 15.0  --   --  15.6  --   --  14.9  HCT 46.7  --   --  49.5  --   --  48.9  PLT 244  --   --  205  --   --  148*  HEPARINUNFRC  --   --    < > <0.10* 0.21* 0.10* 0.45  CREATININE 1.96*  --   --  1.67*  --   --  1.34*  TROPONINIHS  --  7  --   --   --   --   --    < > = values in this interval not displayed.     Estimated Creatinine Clearance: 71.1 mL/min (A) (by C-G formula based on SCr of 1.34 mg/dL (H)).   Medical History: Past Medical History:  Diagnosis Date   Arthritis    hands    Asthma    Chronic diastolic heart failure (HCC)    Complication of anesthesia    slow to awaken   Diabetes mellitus without complication ([place])    Type II   Dyspnea    with asthma- last issue  was early September   Hypertension    Neuropathy    feet   Pneumonia 2017   2017- hospitalized [place], 2018  did not require hospitalization    Medications:  Scheduled:   apixaban  5 mg Oral BID   empagliflozin  10 mg Oral Daily   insulin aspart  0-15 Units Subcutaneous TID WC   insulin aspart  20 Units Subcutaneous TID WC   insulin glargine-yfgn  35 Units Subcutaneous QHS   ipratropium  0.5 mg Nebulization Q6H WA   metoprolol succinate  100 mg Oral Daily   pantoprazole  40 mg Oral Daily   rosuvastatin  10 mg Oral Daily   sodium chloride flush  3 mL Intravenous Q12H    Assessment: 67 yo male with new onset afib w/ RVR likely in setting of  necrotizing PNA. CHADs-VASc = 2. Pharmacy consulted to begin IV heparin with no bolus given history of hemoptysis. No AC pta - received enox 40mg  here for vte ppx (LD 2/2 @ 6384)  Pharmacy consulted to switch to apixaban  Plan:  Start apixaban 5 mg po bid Srop heparin drip Monitor for signs and symptoms of bleeding.  Alanda Slim, PharmD, [facility] Clinical Pharmacist Please see AMION for all Pharmacists' Contact Phone Numbers 07/21/2022, 10:41 AM

## 2022-07-21 NOTE — Progress Notes (Signed)
PT Cancellation Note  Patient Details Name: Hunter Lewis MRN: [id number] DOB: [birth year]   Cancelled Treatment:    Reason Eval/Treat Not Completed: Patient at procedure or test/unavailable. Pt leaving with transportation for [place]. PT to return as able to complete PT eval.  Kittie Plater, PT, DPT Acute Rehabilitation Services Secure chat preferred Office #: [phone]    Berline Lopes 07/21/2022, 8:14 AM

## 2022-07-21 NOTE — Progress Notes (Signed)
ANTICOAGULATION CONSULT NOTE - Follow up  Pharmacy Consult for heparin Indication: atrial fibrillation  No Known Allergies  Patient Measurements: Height: 6' (182.9 cm) Weight: 109.4 kg (241 lb 2.9 oz) IBW/kg (Calculated) : 77.6 Heparin Dosing Weight: 100.8 kg  Vital Signs: Temp: 98.1 F (36.7 C) (02/05 0313) Temp Source: Oral (02/05 0313) BP: 129/69 (02/05 0313) Pulse Rate: 78 (02/05 0313)  Labs: Recent Labs    07/19/22 0304 07/19/22 1148 07/19/22 1656 07/20/22 0029 07/20/22 1001 07/20/22 1811 07/21/22 0344  HGB 15.0  --   --  15.6  --   --  14.9  HCT 46.7  --   --  49.5  --   --  48.9  PLT 244  --   --  205  --   --  148*  HEPARINUNFRC  --   --    < > <0.10* 0.21* 0.10* 0.45  CREATININE 1.96*  --   --  1.67*  --   --   --   TROPONINIHS  --  7  --   --   --   --   --    < > = values in this interval not displayed.     Estimated Creatinine Clearance: 57.1 mL/min (A) (by C-G formula based on SCr of 1.67 mg/dL (H)).   Medical History: Past Medical History:  Diagnosis Date   Arthritis    hands    Asthma    Chronic diastolic heart failure (HCC)    Complication of anesthesia    slow to awaken   Diabetes mellitus without complication ([place])    Type II   Dyspnea    with asthma- last issue  was early September   Hypertension    Neuropathy    feet   Pneumonia 2017   2017- hospitalized [place], 2018  did not require hospitalization    Medications:  Scheduled:   aspirin EC  81 mg Oral Daily   empagliflozin  10 mg Oral Daily   insulin aspart  0-15 Units Subcutaneous TID WC   insulin aspart  20 Units Subcutaneous TID WC   insulin glargine-yfgn  35 Units Subcutaneous QHS   ipratropium  0.5 mg Nebulization Q6H WA   metoprolol succinate  100 mg Oral Daily   pantoprazole  40 mg Oral Daily   rosuvastatin  10 mg Oral Daily   sodium chloride flush  3 mL Intravenous Q12H    Assessment: 67 yo male with new onset afib w/ RVR likely in setting of necrotizing  PNA. CHADs-VASc = 2. Pharmacy consulted to begin IV heparin with no bolus given history of hemoptysis. No AC pta - received enox 40mg  here for vte ppx (LD 2/2 @ 1654)  Heparin level therapeutic: 0.45 on 2350 units/hr; no issues with infusion or s/sx of bleeding; No nose bleed per discussion with RN; CBC stable  Goal of Therapy:  Heparin level 0.3-0.7 units/ml Monitor platelets by anticoagulation protocol: Yes   Plan:  Continue heparin drip at 2350 units/hr  Check heparin level in 8 hours Daily heparin level, CBC   Georga Bora, PharmD Clinical Pharmacist 07/21/2022 4:15 AM Please check AMION for all [place] numbers

## 2022-07-22 LAB — CULTURE, BLOOD (ROUTINE X 2)
Culture: NO GROWTH
Culture: NO GROWTH
Special Requests: ADEQUATE
Special Requests: ADEQUATE

## 2022-07-22 LAB — LEGIONELLA PNEUMOPHILA SEROGP 1 UR AG: L. pneumophila Serogp 1 Ur Ag: NEGATIVE

## 2022-07-22 LAB — CULTURE, RESPIRATORY W GRAM STAIN: Culture: NORMAL

## 2022-07-25 ENCOUNTER — Other Ambulatory Visit (HOSPITAL_COMMUNITY): Payer: Self-pay

## 2022-08-08 ENCOUNTER — Ambulatory Visit (HOSPITAL_BASED_OUTPATIENT_CLINIC_OR_DEPARTMENT_OTHER): Payer: No Typology Code available for payment source | Admitting: Family

## 2022-10-30 ENCOUNTER — Other Ambulatory Visit (HOSPITAL_COMMUNITY): Payer: Self-pay
